# Patient Record
Sex: Female | Born: 1995 | Race: Black or African American | Hispanic: No | Marital: Married | Smoking: Never smoker
Health system: Southern US, Community
[De-identification: ages and names within clinical notes are randomized; demographics above are authoritative.]

## PROBLEM LIST (undated history)

## (undated) DIAGNOSIS — O021 Missed abortion: Secondary | ICD-10-CM

## (undated) DIAGNOSIS — F32A Depression, unspecified: Secondary | ICD-10-CM

## (undated) DIAGNOSIS — F329 Major depressive disorder, single episode, unspecified: Secondary | ICD-10-CM

## (undated) DIAGNOSIS — Z789 Other specified health status: Secondary | ICD-10-CM

## (undated) DIAGNOSIS — Z973 Presence of spectacles and contact lenses: Secondary | ICD-10-CM

## (undated) HISTORY — PX: KNEE SURGERY: SHX244

## (undated) HISTORY — PX: TONSILLECTOMY: SUR1361

---
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
## 2017-01-16 HISTORY — PX: WISDOM TOOTH EXTRACTION: SHX21

## 2017-06-15 ENCOUNTER — Ambulatory Visit (INDEPENDENT_AMBULATORY_CARE_PROVIDER_SITE_OTHER): Payer: No Typology Code available for payment source | Admitting: Family Medicine

## 2017-06-15 ENCOUNTER — Encounter: Payer: Self-pay | Admitting: Family Medicine

## 2017-06-15 VITALS — BP 110/80 | HR 94 | Ht 64.75 in | Wt 150.4 lb

## 2017-06-15 DIAGNOSIS — R5383 Other fatigue: Secondary | ICD-10-CM | POA: Diagnosis not present

## 2017-06-15 DIAGNOSIS — R6889 Other general symptoms and signs: Secondary | ICD-10-CM | POA: Diagnosis not present

## 2017-06-15 LAB — BASIC METABOLIC PANEL
BUN: 11 mg/dL (ref 6–23)
CO2: 25 mEq/L (ref 19–32)
CREATININE: 0.81 mg/dL (ref 0.40–1.20)
Calcium: 9.4 mg/dL (ref 8.4–10.5)
Chloride: 106 mEq/L (ref 96–112)
GFR: 112.67 mL/min (ref >60.00)
Glucose, Bld: 91 mg/dL (ref 70–99)
Potassium: 3.8 mEq/L (ref 3.5–5.1)
Sodium: 140 mEq/L (ref 135–145)

## 2017-06-15 LAB — TSH: TSH: 1.38 u[IU]/mL (ref 0.35–4.50)

## 2017-06-15 LAB — CBC
HCT: 40.5 % (ref 36.0–46.0)
Hemoglobin: 13.3 g/dL (ref 12.0–15.0)
MCHC: 32.8 g/dL (ref 30.0–36.0)
MCV: 87.4 fl (ref 78.0–100.0)
Platelets: 226 10*3/uL (ref 150.0–400.0)
RBC: 4.64 Mil/uL (ref 3.87–5.11)
RDW: 13.1 % (ref 11.5–15.5)
WBC: 4 10*3/uL (ref 4.0–10.5)

## 2017-06-15 NOTE — Patient Instructions (Signed)
It was very nice to meet you! We will contact you once your labs return.

## 2017-06-15 NOTE — Assessment & Plan Note (Signed)
Cold intolerance with fatigue concerning to thyroid disorder, check TSH Will also check CBC to evaluate for anemia.

## 2017-06-15 NOTE — Progress Notes (Signed)
[name] - 22 y.o. female MRN [id number]  Date of birth: [birth year]  Subjective Chief Complaint  Patient presents with  . Other    Cold intolerance    HPI [name] is a 22 y.o. female here today to establish care with new pcp and complaint of cold intolerance.  Reports that she "feels cold all the time" and has noticed that when co-workers are hot she is often times cold.  She has noticed this started after having her baby in 65.  She also endorses some fatigue and dry skin.  She has not noticed any changes to bowel patterns, irregular menstrual periods (although she does have nexplanon), easy bruising or significant weight changes.    No Known Allergies  Past Medical History:  Diagnosis Date  . Medical history non-contributory     Past Surgical History:  Procedure Laterality Date  . KNEE SURGERY    . TONSILLECTOMY      Social History   Socioeconomic History  . Marital status: Single    Spouse name: Not on file  . Number of children: Not on file  . Years of education: Not on file  . Highest education level: Not on file  Occupational History  . Not on file  Social Needs  . Financial resource strain: Not on file  . Food insecurity:    Worry: Not on file    Inability: Not on file  . Transportation needs:    Medical: Not on file    Non-medical: Not on file  Tobacco Use  . Smoking status: Never Smoker  . Smokeless tobacco: Never Used  Substance and Sexual Activity  . Alcohol use: Yes    Comment: occass.   . Drug use: No  . Sexual activity: Yes    Birth control/protection: Implant  Lifestyle  . Physical activity:    Days per week: Not on file    Minutes per session: Not on file  . Stress: Not on file  Relationships  . Social connections:    Talks on phone: Not on file    Gets together: Not on file    Attends religious service: Not on file    Active member of club or organization: Not on file    Attends meetings of clubs or organizations: Not on file    Relationship status: Not on file  Other Topics Concern  . Not on file  Social History Narrative  . Not on file    Family History  Problem Relation Age of Onset  . Diabetes Maternal Grandmother   . Arthritis Maternal Grandmother   . Diabetes Paternal Grandmother     Health Maintenance  Topic Date Due  . CHLAMYDIA SCREENING  06/07/2009  . TETANUS/TDAP  06/07/2013  . PAP SMEAR  06/08/2015  . HIV Screening  06/16/2018 (Originally 06/07/2009)  . INFLUENZA VACCINE  08/16/2017    ----------------------------------------------------------------------------------------------------------------------------------------------------------------------------------------------------------------- Physical Exam BP 110/80   Pulse 94   Ht 5' 4.75" (1.645 m)   Wt 150 lb 6.4 oz (68.2 kg)   BMI 25.22 kg/m   Physical Exam  Constitutional: She is oriented to person, place, and time. She appears well-nourished. No distress.  HENT:  Head: Normocephalic and atraumatic.  Mouth/Throat: Oropharynx is clear and moist.  Eyes: Conjunctivae are normal. No scleral icterus.  Neck: Neck supple. No thyromegaly present.  Cardiovascular: Normal rate and regular rhythm.  Pulmonary/Chest: Effort normal and breath sounds normal.  Musculoskeletal: She exhibits no edema.  Neurological: She is alert and oriented to person,  place, and time.  Skin: Skin is warm and dry. No rash noted.  Psychiatric: She has a normal mood and affect. Her behavior is normal.    ------------------------------------------------------------------------------------------------------------------------------------------------------------------------------------------------------------------- Assessment and Plan  Cold intolerance Cold intolerance with fatigue concerning to thyroid disorder, check TSH Will also check CBC to evaluate for anemia.

## 2018-06-03 ENCOUNTER — Ambulatory Visit (INDEPENDENT_AMBULATORY_CARE_PROVIDER_SITE_OTHER): Payer: 59 | Admitting: Licensed Clinical Social Worker

## 2018-06-03 DIAGNOSIS — F321 Major depressive disorder, single episode, moderate: Secondary | ICD-10-CM

## 2018-06-03 NOTE — Progress Notes (Signed)
Comprehensive Clinical Assessment (CCA) Note  06/03/2018 [name] Nooney [id number]  Virtual Visit via Video Note  I connected with [name] Deyoe on 06/03/18 at  3:00 PM EDT by a video enabled telemedicine application and verified that I am speaking with the correct person using two identifiers.  I discussed the limitations of evaluation and management by telemedicine and the availability of in person appointments. The patient expressed understanding and agreed to proceed.    I discussed the assessment and treatment plan with the patient. The patient was provided an opportunity to ask questions and all were answered. The patient agreed with the plan and demonstrated an understanding of the instructions.   The patient was advised to call back or seek an in-person evaluation if the symptoms worsen or if the condition fails to improve as anticipated.  I provided 56 minutes of non-face-to-face time during this encounter.   Angus Palmsegina Alexander, LCSW     Visit Diagnosis:      ICD-10-CM   1. Major depressive disorder, single episode, moderate (HCC) F32.1       CCA Part One  Part One has been completed on paper by the patient.  (See scanned document in Chart Review)  CCA Part Two A  Intake/Chief Complaint:  CCA Intake With Chief Complaint CCA Part Two Date: 06/03/18 CCA Part Two Time: 1459 Chief Complaint/Presenting Problem: mood changes and feeling unlike herself Patients Currently Reported Symptoms/Problems: irritability, crying spells, feeling very emotional and not usually an emotional person, some days doesn't want to get out of bed Collateral Involvement: mother, grandmother, brothers   Patient Report:  Patient reports that she was married in October 2019, and her husband went off to bootcamp at the beginning of 2020. Last month he was stationed in [place], where she and her daughter will join him. However, they do not yet know when this will happen, and in the meantime patient moved  back into her family home with mother, grandmother, 2 brothers, and cousin. Patient reports that she has been much more emotional for the past few months - before husband moved to [place] - and that she has been keeping to herself to try and keep her emotions at bay. Usually, she states, she is more social and communicative. Patient does not like to cry and acknowledges that she usually tries to push down her emotions, until one day they overwhelm her and husband says that she "explodes".  Patient has a bachelor's degree in nursing and is a Designer, jewelleryregistered nurse at the [employer]. She feels unfulfilled in her work, as it is very much data-entry based and she would like to be more hands-on. 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 She reports being hard on herself because she does not believe she is where she should be in her career (though she has been in the field for only about a year).   Mental Health Symptoms Depression:  Depression: Sleep (too much or little), Irritability, Tearfulness, Change in energy/activity  Mania:  Mania: N/A  Anxiety:   Anxiety: Irritability  Psychosis:  Psychosis: N/A  Trauma:  Trauma: N/A  Obsessions:  Obsessions: N/A  Compulsions:  Compulsions: N/A  Inattention:  Inattention: N/A  Hyperactivity/Impulsivity:  Hyperactivity/Impulsivity: N/A  Oppositional/Defiant Behaviors:  Oppositional/Defiant Behaviors: N/A  Borderline Personality:  Emotional Irregularity:  N/A  Other Mood/Personality Symptoms:      Mental Status Exam Appearance and self-care  Stature:  Stature: Average  Weight:  Weight: Average weight  Clothing:  Clothing: Casual  Grooming:  Grooming: Normal  Cosmetic use:  Cosmetic Use: Age appropriate  Posture/gait:  Posture/Gait: Normal  Motor activity:   Motor Activity: Not Remarkable  Sensorium  Attention:  Attention: Normal  Concentration:  Concentration: Normal  Orientation:  Orientation: X5  Recall/memory:  Recall/Memory: Normal  Affect and Mood  Affect:  Affect: Tearful  Mood:  Mood: Depressed  Relating  Eye contact:  Eye Contact: Normal  Facial expression:  Facial Expression: Responsive  Attitude toward examiner:  Attitude Toward Examiner: Cooperative  Thought and Language  Speech flow: Speech Flow: Normal  Thought content:  Thought Content: Appropriate to mood and circumstances  Preoccupation:     Hallucinations:     Organization:     Company secretary of Knowledge:  Fund of Knowledge: Average  Intelligence:  Intelligence: Above Average  Abstraction:  Abstraction: Abstract, Normal  Judgement:  Judgement: Fair  Dance movement psychotherapist:  Reality Testing: Adequate  Insight:  Insight: Fair  Decision Making:  Decision Making: Normal  Social Functioning  Social Maturity:  Social Maturity: Isolates(Usually social, but lately keeps to herself and doesn't communicate well with others)  Social Judgement:  Social Judgement: Normal  Stress  Stressors:  Stressors: Transitions, Work  Coping Ability:  Coping Ability: Building surveyor Deficits:     Supports:      Family and Psychosocial History: Family history Marital status: Married Number of Years Married: 0.75 What types of issues is patient dealing with in the relationship?: he is stationed in [employer] camp, she is unsure when she and daughter will join him Additional relationship information: good relationships with "ups and downs" Are you sexually active?: Yes What is your sexual orientation?: Straight Does patient have children?: Yes How many children?: 1 How is patient's relationship with their children?: 64 year old daughter, good relationship  Childhood History:  Childhood History By whom was/is the patient raised?: Mother, Grandparents Additional  childhood history information: raised by mother and grandmother, "it was okay, pretty normal", surface level relationship with dad then and now Description of patient's relationship with caregiver when they were a child: good with mom, not always good with grandmother Patient's description of current relationship with people who raised him/her: supportive with mom and grandmother Number of Siblings: 3 Description of patient's current relationship with siblings: 2 full brothers, one step brother; one brother is a twin Did patient suffer any verbal/emotional/physical/sexual abuse as a child?: No Did patient suffer from severe childhood neglect?: No Has patient ever been sexually abused/assaulted/raped as an adolescent or adult?: No Was the patient ever a victim of a crime or a disaster?: No Witnessed domestic violence?: No Has patient been effected by domestic violence as an adult?: No  CCA Part Two B  Employment/Work Situation: Employment / Work Situation Employment situation: Employed Where is patient currently employed?: [employer] How long has patient been employed?: almost 1 year Patient's job has been impacted by current illness: No(mood changes have been noticable to coworkers but not a problem) What is the longest time patient has a held a job?: his job Did You Receive Any Psychiatric Treatment/Services While in Equities trader?: No Are There Guns or Other Weapons in Your Home?: No Are These Weapons Safely Secured?: Yes  Education: Education Last Grade Completed: 16 Did Garment/textile technologist From  High School?: Yes Did You Attend College?: Yes What Type of College Degree Do you Have?: Bachelors  Did You Attend Graduate School?: No What Was Your Major?: nursing Did You Have An Individualized Education Program (IIEP): No Did You Have Any Difficulty At School?: No  Religion: Religion/Spirituality Are You A Religious Person?: No How Might This Affect Treatment?: has  religious beliefs but not really religious  Leisure/Recreation: Leisure / Recreation Leisure and Hobbies: enjoys being active, sports, cosmetology  Exercise/Diet: Exercise/Diet Do You Exercise?: Yes What Type of Exercise Do You Do?: Other (Comment), Run/Walk(sports) How Many Times a Week Do You Exercise?: 1-3 times a week Have You Gained or Lost A Significant Amount of Weight in the Past Six Months?: No Do You Follow a Special Diet?: No Do You Have Any Trouble Sleeping?: Yes Explanation of Sleeping Difficulties: sleeps, sometimes wants to sleep a lot, but mind will not calm down and let her sleep  CCA Part Two C  Alcohol/Drug Use: Alcohol / Drug Use History of alcohol / drug use?: No history of alcohol / drug abuse                      CCA Part Three  ASAM's:  Six Dimensions of Multidimensional Assessment  Dimension 1:  Acute Intoxication and/or Withdrawal Potential:  Dimension 1:  Comments: drinks only about one drink a month; sometimes smokes marijuana when stressed but not regularly  Dimension 2:  Biomedical Conditions and Complications:     Dimension 3:  Emotional, Behavioral, or Cognitive Conditions and Complications:  Dimension 3:  Comments: mood swings, anger/irritability  Dimension 4:  Readiness to Change:  Dimension 4:  Comments: contemplative  Dimension 5:  Relapse, Continued use, or Continued Problem Potential:  Dimension 5:  Comments: unknown when she and daughter will join husband in [place]  Dimension 6:  Recovery/Living Environment:      Substance use Disorder (SUD)    Social Function:  Social Functioning Social Maturity: Isolates(Usually social, but lately keeps to herself and doesn't communicate well with others) Social Judgement: Normal  Stress:  Stress Stressors: Transitions, Work Coping Ability: Overwhelmed Patient Takes Medications The Way The Doctor Instructed?: Yes  Risk Assessment- Self-Harm Potential: Risk Assessment For Self-Harm  Potential Thoughts of Self-Harm: No current thoughts  Risk Assessment -Dangerous to Others Potential: Risk Assessment For Dangerous to Others Potential Method: No Plan  DSM5 Diagnoses: Patient Active Problem List   Diagnosis Date Noted  . Cold intolerance 06/15/2017  . Fatigue 06/15/2017    Patient Centered Plan: Patient is on the following Treatment Plan(s):  Depression  Recommendations for Services/Supports/Treatments: Recommendations for Services/Supports/Treatments Recommendations For Services/Supports/Treatments: Individual Therapy  Treatment Plan Summary:   Elevate mood and show evidence of usual energy, activities and socialization level; Reduce irritability and increase normal social interaction with family and friends.   Angus Palms

## 2018-06-12 ENCOUNTER — Telehealth (HOSPITAL_COMMUNITY): Payer: Self-pay | Admitting: Licensed Clinical Social Worker

## 2018-06-12 NOTE — Telephone Encounter (Signed)
Patient called.  She states that she was suppose to be getting a call back from the office for a follow up after her appointment.   Do you know anything about this?   Patients CB # [phone]

## 2018-06-14 NOTE — Telephone Encounter (Signed)
Apt was made with Shanda Bumps. Nothing further is needed.

## 2018-07-01 ENCOUNTER — Other Ambulatory Visit: Payer: Self-pay

## 2018-07-01 ENCOUNTER — Encounter (HOSPITAL_COMMUNITY): Payer: Self-pay | Admitting: Licensed Clinical Social Worker

## 2018-07-01 ENCOUNTER — Ambulatory Visit (INDEPENDENT_AMBULATORY_CARE_PROVIDER_SITE_OTHER): Payer: 59 | Admitting: Licensed Clinical Social Worker

## 2018-07-01 DIAGNOSIS — F321 Major depressive disorder, single episode, moderate: Secondary | ICD-10-CM

## 2018-07-01 NOTE — Progress Notes (Signed)
Virtual Visit via Video Note  I connected with [name] on 07/01/18 at  4:30 PM EDT by a video enabled telemedicine application and verified that I am speaking with the correct person using two identifiers.    I discussed the limitations of evaluation and management by telemedicine and the availability of in person appointments. The patient expressed understanding and agreed to proceed.  Type of Therapy: Individual Therapy  Treatment Goals addressed: "Elevate mood and show evidence of usual energy, activities, and socialization levels. Reduce irritability and increase normal social interaction with family and friends".   Interventions: CBT, Motivational Interviewing, grounding and mindfulness techniques, psychoeducation  Summary: [name] is a 23 y.o. female who presents with MDD, single episode, moderate  Suicidal/Homicidal: No - without intent/plan  Therapist Response: [name] met with clinician for an individual session. [name] discussed her psychiatric symptoms, her current life events and her homework. She identified previous depressive sxs in CCA and reported that since then, she has become more aware of the root cause. She and husband have a hx of infidelity issues and they have resurfaced again over the past several weeks. Clinician explored the hx of their relationship, as well as current status. Clinician processed options and encouraged [name] to create a pros and cons list in order to get her thoughts out on paper. Clinician reviewed the 5 Love Languages and noted ways to help each other, even though they speak different love languages. Clinician utilized MI OARS to reflect thoughts and feelings. Clinician also agreed that there would be ongoing concerns and fears that husband would be unfaithful, as he has in the past. Clinician urged [name] to spend some time thinking about how the relationship could be or would be if they were living together. Clinician also noted the importance of  self-care and valuation.   Plan: Return again in 1-2 weeks.  Diagnosis: Axis I: MDD, single episode, moderate.   I discussed the assessment and treatment plan with the patient. The patient was provided an opportunity to ask questions and all were answered. The patient agreed with the plan and demonstrated an understanding of the instructions.   The patient was advised to call back or seek an in-person evaluation if the symptoms worsen or if the condition fails to improve as anticipated.  I provided 45 minutes of non-face-to-face time during this encounter.   Mindi Curling, LCSW

## 2018-07-09 ENCOUNTER — Ambulatory Visit (INDEPENDENT_AMBULATORY_CARE_PROVIDER_SITE_OTHER): Payer: 59 | Admitting: Licensed Clinical Social Worker

## 2018-07-09 ENCOUNTER — Encounter (HOSPITAL_COMMUNITY): Payer: Self-pay | Admitting: Licensed Clinical Social Worker

## 2018-07-09 ENCOUNTER — Other Ambulatory Visit: Payer: Self-pay

## 2018-07-09 DIAGNOSIS — F321 Major depressive disorder, single episode, moderate: Secondary | ICD-10-CM

## 2018-07-09 NOTE — Progress Notes (Signed)
Virtual Visit via Video Note  I connected with [name] on 07/09/18 at  1:30 PM EDT by a video enabled telemedicine application and verified that I am speaking with the correct person using two identifiers.     I discussed the limitations of evaluation and management by telemedicine and the availability of in person appointments. The patient expressed understanding and agreed to proceed.  Type of Therapy: Individual Therapy  Treatment Goals addressed: "Elevate mood and show evidence of usual energy, activities, and socialization levels. Reduce irritability and increase normal social interaction with family and friends".   Interventions: CBT, Motivational Interviewing, grounding and mindfulness techniques, psychoeducation  Summary: [name] is a 23 y.o. female who presents with MDD, single episode, moderate  Suicidal/Homicidal: No - without intent/plan  Therapist Response: [name] met with clinician for an individual session. [name] discussed her psychiatric symptoms, her current life events and her homework. [name] provided updates about her relationship with husband and daughter. She reports her daughter went to spend the weekend with husband and his family for Father's Day. Clinician explored comfort and sense of safety with daughter spending time with him. [name] reports she has no concerns about daughter's safety, but noted that she felt guilty when daughter shared a picture she drew with her, her husband , and her daughter all together. She reports she does not think that can be a reality again. Clinician processed thoughts and feelings about this and explored options for their relationship. Clinician discussed values in a healthy marriage. Clinician also explored [name] and her husband's experiences with observing other people's marriages, such as parents, grandparents, etc. [name] reports that while she witnessed and came from solid, long-term relationships, her husband had never  reallywitnessed good relationships in his family. Clinician provided feedback about this and noted that he may not know how to have a healthy relationship, which may be why he sabotages their relationship. Clinician encouraged [name] to continue to evaluate her own needs and expectations from a relationship and to communicate those needs with her husband.   Plan: Return again in 1-2 weeks.  Diagnosis: Axis I: MDD, single episode, moderate    I discussed the assessment and treatment plan with the patient. The patient was provided an opportunity to ask questions and all were answered. The patient agreed with the plan and demonstrated an understanding of the instructions.   The patient was advised to call back or seek an in-person evaluation if the symptoms worsen or if the condition fails to improve as anticipated.  I provided 45 minutes of non-face-to-face time during this encounter.   Mindi Curling, LCSW

## 2018-07-23 ENCOUNTER — Ambulatory Visit (INDEPENDENT_AMBULATORY_CARE_PROVIDER_SITE_OTHER): Payer: 59 | Admitting: Licensed Clinical Social Worker

## 2018-07-23 ENCOUNTER — Other Ambulatory Visit: Payer: Self-pay

## 2018-07-23 ENCOUNTER — Encounter (HOSPITAL_COMMUNITY): Payer: Self-pay | Admitting: Licensed Clinical Social Worker

## 2018-07-23 DIAGNOSIS — F321 Major depressive disorder, single episode, moderate: Secondary | ICD-10-CM

## 2018-07-23 NOTE — Progress Notes (Signed)
Virtual Visit via Video Note  I connected with [name] on 07/23/18 at  2:30 PM EDT by a video enabled telemedicine application and verified that I am speaking with the correct person using two identifiers.    I discussed the limitations of evaluation and management by telemedicine and the availability of in person appointments. The patient expressed understanding and agreed to proceed.  Type of Therapy: Individual Therapy  Treatment Goals addressed:"Elevate mood and show evidence of usual energy, activities, and socialization levels. Reduce irritability and increase normal social interaction with family and friends".  Interventions: CBT, Motivational Interviewing, grounding and mindfulness techniques, psychoeducation  Summary:[name] is a 23y.o. female who presents with MDD, single episode, moderate  Suicidal/Homicidal: No - without intent/plan  Therapist Response:[name] with clinician for an individual session. [name] her psychiatric symptoms, her current life events and her homework. [name] reports that she continues to struggle with decisions about where to go with her relationship with her husband. She reports she continues to have trust issues, but agreed that if they were together, they would more easily mend the relationship. Clinician processed her role in the problems, as [name] reports she has not really taken into account her own role in challenging the relationship. Clinician explored how the relationship was before they got married, when they lived together, and what it was like when he was gone for 4 months in basic training. [name] reported they argued a lot over small things, and she did get mad while he was gone. She agreed this was detrimental to the relationship, and then she was mean to him. Clinician reflected that during that time period, her response to him, due to her depression led him to talk to someone else. While this was not the correct  response, it was what happened. Clinician encouraged [name] to consider what things would be like if they were together and if she was interested in making this family work.   Plan: Return again in1-2weeks.  Diagnosis: Axis I:MDD, single episode, moderate    I discussed the assessment and treatment plan with the patient. The patient was provided an opportunity to ask questions and all were answered. The patient agreed with the plan and demonstrated an understanding of the instructions.   The patient was advised to call back or seek an in-person evaluation if the symptoms worsen or if the condition fails to improve as anticipated.  I provided 45 minutes of non-face-to-face time during this encounter.   Mindi Curling, LCSW

## 2018-07-31 ENCOUNTER — Ambulatory Visit (HOSPITAL_COMMUNITY): Payer: 59 | Admitting: Licensed Clinical Social Worker

## 2018-08-01 ENCOUNTER — Ambulatory Visit (INDEPENDENT_AMBULATORY_CARE_PROVIDER_SITE_OTHER): Payer: 59 | Admitting: Licensed Clinical Social Worker

## 2018-08-01 ENCOUNTER — Other Ambulatory Visit: Payer: Self-pay

## 2018-08-01 ENCOUNTER — Encounter (HOSPITAL_COMMUNITY): Payer: Self-pay | Admitting: Licensed Clinical Social Worker

## 2018-08-01 DIAGNOSIS — F321 Major depressive disorder, single episode, moderate: Secondary | ICD-10-CM

## 2018-08-01 NOTE — Progress Notes (Signed)
Virtual Visit via Video Note  I connected with Toyoko Bedoya on 08/01/18 at  8:00 AM EDT by a video enabled telemedicine application and verified that I am speaking with the correct person using two identifiers.    I discussed the limitations of evaluation and management by telemedicine and the availability of in person appointments. The patient expressed understanding and agreed to proceed. Type of Therapy: Individual Therapy  Treatment Goals addressed:"Elevate mood and show evidence of usual energy, activities, and socialization levels. Reduce irritability and increase normal social interaction with family and friends".  Interventions: CBT, Motivational Interviewing, grounding and mindfulness techniques, psychoeducation  Summary:Ruthie Asper is a 23y.o. female who presents with MDD, single episode, moderate  Suicidal/Homicidal: No - without intent/plan  Therapist Response:Annamariemet with clinician for an individual session. Brunelladiscussed her psychiatric symptoms, her current life events and her homework.Myrakle reports that she has been doing a bit better this week. She reports she will see her husband this weekend for her daughter's birthday and after that, she probably won't see him again for a while. Clinician explored updates in the relationship and noted ongoing evidence that husband continues to seek out female companionship outside of their marriage. Clinician processed these concerns and discussed Annali's thoughts and feelings. Jameyah reports that both she and her husband do not like to be alone. Jalin reports that she also seeks out female attention and processed on and off relationship with another man when things have gone badly with husband. Clinician discussed challenges in Rosanna's experiences with men and noted communication issues and problems with expectations. Devaney agreed that she usually will wait to express her feelings until she is really upset, which often catches her  partners by surprise and makes her come across as very critical. Clinician explored reasons why she does not say how she feels before she gets really upset and Adara did not really have an answer. Clinician encouraged Keisa to examine herself in these relationships and noted the importance of checking her expectations of others. Clinician urged Kierre to especially look at her trigger of being "disrespected" and people not being "considerate of her feelings".   Plan: Return again in1-2weeks.  Diagnosis: Axis I:MDD, single episode, moderate    I discussed the assessment and treatment plan with the patient. The patient was provided an opportunity to ask questions and all were answered. The patient agreed with the plan and demonstrated an understanding of the instructions.   The patient was advised to call back or seek an in-person evaluation if the symptoms worsen or if the condition fails to improve as anticipated.  I provided 45 minutes of non-face-to-face time during this encounter.   Jessica R Schlosberg, LCSW  

## 2018-08-08 ENCOUNTER — Other Ambulatory Visit: Payer: Self-pay

## 2018-08-08 ENCOUNTER — Ambulatory Visit (HOSPITAL_COMMUNITY): Payer: 59 | Admitting: Licensed Clinical Social Worker

## 2018-08-15 ENCOUNTER — Other Ambulatory Visit: Payer: Self-pay

## 2018-08-15 ENCOUNTER — Ambulatory Visit (HOSPITAL_COMMUNITY): Payer: 59 | Admitting: Licensed Clinical Social Worker

## 2018-08-22 ENCOUNTER — Ambulatory Visit (HOSPITAL_COMMUNITY): Payer: 59 | Admitting: Licensed Clinical Social Worker

## 2018-08-28 ENCOUNTER — Inpatient Hospital Stay (HOSPITAL_COMMUNITY): Payer: 59

## 2018-08-28 ENCOUNTER — Other Ambulatory Visit: Payer: Self-pay

## 2018-08-28 ENCOUNTER — Encounter (HOSPITAL_COMMUNITY): Payer: Self-pay

## 2018-08-28 ENCOUNTER — Inpatient Hospital Stay (HOSPITAL_COMMUNITY)
Admission: AD | Admit: 2018-08-28 | Discharge: 2018-08-28 | Disposition: A | Discharge status: Home / Self Care | Payer: 59 | Attending: Obstetrics and Gynecology | Admitting: Obstetrics and Gynecology

## 2018-08-28 DIAGNOSIS — Z6790 Unspecified blood type, Rh positive: Secondary | ICD-10-CM

## 2018-08-28 DIAGNOSIS — Z3A01 Less than 8 weeks gestation of pregnancy: Secondary | ICD-10-CM

## 2018-08-28 DIAGNOSIS — O209 Hemorrhage in early pregnancy, unspecified: Secondary | ICD-10-CM

## 2018-08-28 DIAGNOSIS — O3680X0 Pregnancy with inconclusive fetal viability, not applicable or unspecified: Secondary | ICD-10-CM

## 2018-08-28 HISTORY — DX: Depression, unspecified: F32.A

## 2018-08-28 LAB — CBC
HCT: 37.8 % (ref 36.0–46.0)
Hemoglobin: 12 g/dL (ref 12.0–15.0)
MCH: 28.4 pg (ref 26.0–34.0)
MCHC: 31.7 g/dL (ref 30.0–36.0)
MCV: 89.4 fL (ref 80.0–100.0)
Platelets: 213 10*3/uL (ref 150–400)
RBC: 4.23 MIL/uL (ref 3.87–5.11)
RDW: 12.7 % (ref 11.5–15.5)
WBC: 4.5 10*3/uL (ref 4.0–10.5)
nRBC: 0 % (ref 0.0–0.2)

## 2018-08-28 LAB — WET PREP, GENITAL
Clue Cells Wet Prep HPF POC: NONE SEEN
Sperm: NONE SEEN
Trich, Wet Prep: NONE SEEN
Yeast Wet Prep HPF POC: NONE SEEN

## 2018-08-28 LAB — URINALYSIS, ROUTINE W REFLEX MICROSCOPIC
Bilirubin Urine: NEGATIVE
Glucose, UA: NEGATIVE mg/dL
Ketones, ur: NEGATIVE mg/dL
Leukocytes,Ua: NEGATIVE
Nitrite: NEGATIVE
Protein, ur: NEGATIVE mg/dL
Specific Gravity, Urine: 1.011 (ref 1.005–1.030)
pH: 5 (ref 5.0–8.0)

## 2018-08-28 LAB — HCG, QUANTITATIVE, PREGNANCY: hCG, Beta Chain, Quant, S: 176 m[IU]/mL — ABNORMAL HIGH (ref <5)

## 2018-08-28 LAB — POCT PREGNANCY, URINE: Preg Test, Ur: POSITIVE — AB

## 2018-08-28 NOTE — MAU Note (Signed)
[name] is a 23 y.o. at [redacted]w[redacted]d here in MAU reporting: got up around 0300 to use bathroom and saw some bleeding, states she put a pad on. Around 0700 she noticed the bleeding was trickling, and around 0800 she noticed some quarter-sized clots. Having some mild cramping.  LMP: 07/13/18  Onset of complaint: this morning  Pain score: 1/10  Vitals:   08/28/18 0941  BP: 113/63  Pulse: 88  Resp: 18  Temp: 98.6 F (37 C)  SpO2: 100%      Lab orders placed from triage: UA, UPT

## 2018-08-28 NOTE — Discharge Instructions (Signed)
Vaginal Bleeding During Pregnancy, First Trimester ° °A small amount of bleeding from the vagina (spotting) is relatively common during early pregnancy. It usually stops on its own. Various things may cause bleeding or spotting during early pregnancy. Some bleeding may be related to the pregnancy, and some may not. In many cases, the bleeding is normal and is not a problem. However, bleeding can also be a sign of something serious. Be sure to tell your health care provider about any vaginal bleeding right away. °Some possible causes of vaginal bleeding during the first trimester include: °· Infection or inflammation of the cervix. °· Growths (polyps) on the cervix. °· Miscarriage or threatened miscarriage. °· Pregnancy tissue developing outside of the uterus (ectopic pregnancy). °· A mass of tissue developing in the uterus due to an egg being fertilized incorrectly (molar pregnancy). °Follow these instructions at home: °Activity °· Follow instructions from your health care provider about limiting your activity. Ask what activities are safe for you. °· If needed, make plans for someone to help with your regular activities. °· Do not have sex or orgasms until your health care provider says that this is safe. °General instructions °· Take over-the-counter and prescription medicines only as told by your health care provider. °· Pay attention to any changes in your symptoms. °· Do not use tampons or douche. °· Write down how many pads you use each day, how often you change pads, and how soaked (saturated) they are. °· If you pass any tissue from your vagina, save the tissue so you can show it to your health care provider. °· Keep all follow-up visits as told by your health care provider. This is important. °Contact a health care provider if: °· You have vaginal bleeding during any part of your pregnancy. °· You have cramps or labor pains. °· You have a fever. °Get help right away if: °· You have severe cramps in your  back or abdomen. °· You pass large clots or a large amount of tissue from your vagina. °· Your bleeding increases. °· You feel light-headed or weak, or you faint. °· You have chills. °· You are leaking fluid or have a gush of fluid from your vagina. °Summary °· A small amount of bleeding (spotting) from the vagina is relatively common during early pregnancy. °· Various things may cause bleeding or spotting in early pregnancy. °· Be sure to tell your health care provider about any vaginal bleeding right away. °This information is not intended to replace advice given to you by your health care provider. Make sure you discuss any questions you have with your health care provider. °Document Released: 10/12/2004 Document Revised: 04/23/2018 Document Reviewed: 04/06/2016 °Elsevier Patient Education © 2020 [employer] ° °

## 2018-08-28 NOTE — MAU Provider Note (Addendum)
History     CSN: [id number]  Arrival date and time: 08/28/18 [id number]   First Provider Initiated Contact with Patient 08/28/18 0945     Chief Complaint  Patient presents with  . Abdominal Pain  . Vaginal Bleeding   HPI [name] is a 23 y/o G2P1001 @ 3649w4d by sure LMP presents to MAU for vaginal bleeding and lower abdominal pain. This was a planned pregnancy. Pt reports bleeding started at 0300 and similar to day one of menses that has since slowed down to a trickle. At 0800, she passed quarter sized blood clots. Pt states mild cramping and diffuse lower abdominal pain. Denies fever, vaginal discharge, dysuria, N/V or flank pain.  OB History    Gravida  2   Para  1   Term  1   Preterm      AB      Living  1     SAB      TAB      Ectopic      Multiple      Live Births  1           Past Medical History:  Diagnosis Date  . Depression   . Medical history non-contributory     Past Surgical History:  Procedure Laterality Date  . KNEE SURGERY    . TONSILLECTOMY    . WISDOM TOOTH EXTRACTION      Family History  Problem Relation Age of Onset  . Diabetes Maternal Grandmother   . Arthritis Maternal Grandmother   . Diabetes Paternal Grandmother     Social History   Tobacco Use  . Smoking status: Never Smoker  . Smokeless tobacco: Never Used  Substance Use Topics  . Alcohol use: Yes    Comment: occass.   . Drug use: No    Allergies: No Known Allergies  No medications prior to admission.    Review of Systems  Constitutional: Negative for chills, fatigue and fever.  Respiratory: Negative for cough, chest tightness and shortness of breath.   Cardiovascular: Negative for chest pain, palpitations and leg swelling.  Gastrointestinal: Negative for abdominal pain, constipation, diarrhea, nausea and vomiting.  Endocrine: Positive for cold intolerance.  Genitourinary: Positive for vaginal bleeding. Negative for dysuria, flank pain, genital sores,  hematuria, pelvic pain, vaginal discharge and vaginal pain.  Musculoskeletal: Negative for back pain and myalgias.  Skin: Negative for color change and rash.  Neurological: Negative for dizziness, light-headedness and headaches.   Physical Exam   Blood pressure 113/63, pulse 88, temperature 98.6 F (37 C), temperature source Oral, resp. rate 18, height 5\' 4"  (1.626 m), weight 69.7 kg, last menstrual period 07/13/2018, SpO2 100 %, unknown if currently breastfeeding.  Physical Exam  Nursing note and vitals reviewed. Constitutional: She is oriented to person, place, and time. She appears well-developed and well-nourished.  Appears comfortable  HENT:  Head: Normocephalic and atraumatic.  Eyes: Conjunctivae are normal.  Cardiovascular: Normal rate, regular rhythm and normal heart sounds.  Respiratory: Effort normal and breath sounds normal.  GI: Soft. Bowel sounds are normal. She exhibits no mass. There is no rebound and no guarding.  Diffuse lower abdominal tenderness  Genitourinary:    No vaginal discharge.     Genitourinary Comments: Pelvic Exam:  External genitalia: normal appearing vulva with no masses, tenderness or lesions Vagina: no abnormal discharge, minimal bleeding Cervix: pink, no cervical motion tenderness Adnexa: nontender, no palpable masses   Neurological: She is alert and oriented to person, place,  and time.  Skin: Skin is warm and dry. No rash noted. No erythema.  Psychiatric: She has a normal mood and affect. Her behavior is normal.   Results for orders placed or performed during the hospital encounter of 08/28/18 (from the past 24 hour(s))  Pregnancy, urine POC     Status: Abnormal   Collection Time: 08/28/18  9:25 AM  Result Value Ref Range   Preg Test, Ur POSITIVE (A) NEGATIVE  Urinalysis, Routine w reflex microscopic     Status: Abnormal   Collection Time: 08/28/18  9:30 AM  Result Value Ref Range   Color, Urine STRAW (A) YELLOW   APPearance CLEAR CLEAR    Specific Gravity, Urine 1.011 1.005 - 1.030   pH 5.0 5.0 - 8.0   Glucose, UA NEGATIVE NEGATIVE mg/dL   Hgb urine dipstick LARGE (A) NEGATIVE   Bilirubin Urine NEGATIVE NEGATIVE   Ketones, ur NEGATIVE NEGATIVE mg/dL   Protein, ur NEGATIVE NEGATIVE mg/dL   Nitrite NEGATIVE NEGATIVE   Leukocytes,Ua NEGATIVE NEGATIVE   WBC, UA 0-5 0 - 5 WBC/hpf   Bacteria, UA RARE (A) NONE SEEN   Squamous Epithelial / LPF 0-5 0 - 5   Mucus PRESENT   CBC     Status: None   Collection Time: 08/28/18 10:25 AM  Result Value Ref Range   WBC 4.5 4.0 - 10.5 K/uL   RBC 4.23 3.87 - 5.11 MIL/uL   Hemoglobin 12.0 12.0 - 15.0 g/dL   HCT 82.937.8 56.236.0 - 13.046.0 %   MCV 89.4 80.0 - 100.0 fL   MCH 28.4 26.0 - 34.0 pg   MCHC 31.7 30.0 - 36.0 g/dL   RDW 86.512.7 78.411.5 - 69.615.5 %   Platelets 213 150 - 400 K/uL   nRBC 0.0 0.0 - 0.2 %  hCG, quantitative, pregnancy     Status: Abnormal   Collection Time: 08/28/18 10:25 AM  Result Value Ref Range   hCG, Beta Chain, Quant, S 176 (H) <5 mIU/mL  Wet prep, genital     Status: Abnormal   Collection Time: 08/28/18 10:35 AM   Specimen: Cervical/Vaginal swab  Result Value Ref Range   Yeast Wet Prep HPF POC NONE SEEN NONE SEEN   Trich, Wet Prep NONE SEEN NONE SEEN   Clue Cells Wet Prep HPF POC NONE SEEN NONE SEEN   WBC, Wet Prep HPF POC MANY (A) NONE SEEN   Sperm NONE SEEN    [place] Ob Less Than 14 Weeks With Ob Transvaginal  Result Date: 08/28/2018 CLINICAL DATA:  Vaginal bleeding. Estimated gestational age of [redacted] weeks, 4 days by LMP. EXAM: OBSTETRIC <14 WK US AND TRANSVAGINAL OB US TECHNIQUE: Both transabdominal and transvaginal ultrasound examinations were performed for complete evaluation of the gestation as well as the maternal uterus, adnexal regions, and pelvic cul-de-sac. Transvaginal technique was performed to assess early pregnancy. COMPARISON:  None. FINDINGS: Intrauterine gestational sac: None. Maternal uterus/adnexae: Small 1 cm mobile area of complex hyper and hypoechoic material  within the endometrial canal. The ovaries are unremarkable. Left corpus luteum. IMPRESSION: 1. No IUP is visualized. By definition, in the setting of a positive pregnancy test, this reflects a pregnancy of unknown location. Differential considerations include early normal IUP, abnormal IUP/missed abortion, or nonvisualized ectopic pregnancy. Serial beta HCG is suggested. Consider repeat pelvic ultrasound in 14 days. 2. Small 1 cm mobile area of complex hyper and hypoechoic material within the endometrial canal, suspicious for blood products. Electronically Signed   By: Vickki HearingWilliam T Derry M.D.  On: 08/28/2018 11:41   MAU Course  Procedures - N/A  MDM R/o appendicitis - No symptoms of periumbilical to RLQ pain. Afebrile and normal WBC. R/o UTI - No UA WBC or symptoms of dysuria, frequency, urgency. R/o STI - No discharge, wet prep WNL, GC/C pending   No IUP or adnexal mass seen on [place], suspect failed pregnancy but cannot r/o early pregnancy or ectopic pregnancy-discussed with pt. Will follow quant in 48 hrs. Message left on OB office voicemail.   Assessment and Plan  1. Pregnancy of unknown location 2. Rh positive  - Discharge home - [facility] up at [facility] in 2 days  - Repeat serial BhCG in 48 hours. Appointment scheduled for 8/14. - Pt discharged home with strict return precautions  Julianne Handler, CNM 08/28/2018, 12:11 PM

## 2018-08-29 LAB — GC/CHLAMYDIA PROBE AMP (~~LOC~~) NOT AT ARMC
Chlamydia: NEGATIVE
Neisseria Gonorrhea: NEGATIVE

## 2018-09-04 ENCOUNTER — Other Ambulatory Visit: Payer: Self-pay

## 2018-09-04 ENCOUNTER — Encounter (HOSPITAL_COMMUNITY): Payer: Self-pay | Admitting: Licensed Clinical Social Worker

## 2018-09-04 ENCOUNTER — Ambulatory Visit (INDEPENDENT_AMBULATORY_CARE_PROVIDER_SITE_OTHER): Payer: 59 | Admitting: Licensed Clinical Social Worker

## 2018-09-04 DIAGNOSIS — F321 Major depressive disorder, single episode, moderate: Secondary | ICD-10-CM | POA: Diagnosis not present

## 2018-09-04 NOTE — Progress Notes (Signed)
Virtual Visit via Video Note  I connected with [name] on 09/04/18 at  4:30 PM EDT by a video enabled telemedicine application and verified that I am speaking with the correct person using two identifiers.     I discussed the limitations of evaluation and management by telemedicine and the availability of in person appointments. The patient expressed understanding and agreed to proceed.  Treatment Goals addressed:"Elevate mood and show evidence of usual energy, activities, and socialization levels. Reduce irritability and increase normal social interaction with family and friends".  Interventions: CBT, Motivational Interviewing, grounding and mindfulness techniques, psychoeducation  Summary:[name] is a 23y.o. female who presents with MDD, single episode, moderate  Suicidal/Homicidal: No - without intent/plan  Therapist Response:[name] with clinician for an individual session. [name], her current life events and her homework.[name] reported she was in [place] with her husband and had been up there for about 2 weeks. Clinician explored the status of the relationship and noted that things were going much better. She reports that she plans to find a job and move to [place] to be with her husband, but she will not officially move until employment is secured. Clinician explored marriage counseling. [name] reported they have already had 2 sessions and things were going well.  [name] processed recent miscarriage. Clinician discussed thoughts and feelings. Clinician also identified the importance of allowing herself to grieve the loss, as she had gotten excited for a new baby. [name] reports she understands it was not the "right" time, as she was getting ready to move, start a new job, she would be alone, and her husband may be deployed in January. Clinician provided grief counseling.   Plan: Return again in1-2weeks.  Diagnosis: Axis I:MDD, single episode,  moderate   I discussed the assessment and treatment plan with the patient. The patient was provided an opportunity to ask questions and all were answered. The patient agreed with the plan and demonstrated an understanding of the instructions.   The patient was advised to call back or seek an in-person evaluation if the symptoms worsen or if the condition fails to improve as anticipated.  I provided 45 minutes of non-face-to-face time during this encounter.   Mindi Curling, LCSW

## 2018-09-16 ENCOUNTER — Ambulatory Visit (INDEPENDENT_AMBULATORY_CARE_PROVIDER_SITE_OTHER): Payer: 59 | Admitting: Licensed Clinical Social Worker

## 2018-09-16 ENCOUNTER — Encounter (HOSPITAL_COMMUNITY): Payer: Self-pay | Admitting: Licensed Clinical Social Worker

## 2018-09-16 ENCOUNTER — Other Ambulatory Visit: Payer: Self-pay

## 2018-09-16 DIAGNOSIS — F321 Major depressive disorder, single episode, moderate: Secondary | ICD-10-CM

## 2018-09-16 NOTE — Progress Notes (Signed)
Virtual Visit via Video Note  I connected with [name] on 09/16/18 at  3:30 PM EDT by a video enabled telemedicine application and verified that I am speaking with the correct person using two identifiers.     I discussed the limitations of evaluation and management by telemedicine and the availability of in person appointments. The patient expressed understanding and agreed to proceed.   Treatment Goals addressed:"Elevate mood and show evidence of usual energy, activities, and socialization levels. Reduce irritability and increase normal social interaction with family and friends".  Interventions: CBT, Motivational Interviewing, grounding and mindfulness techniques, psychoeducation  Summary:[name] is a 23y.o. female who presents with MDD, single episode, moderate  Suicidal/Homicidal: No - without intent/plan  Therapist Response:Caroline Reesemet with clinician for an individual session. Caroline Reesediscussed her psychiatric symptoms, her current life events and her homework.[name] reported she returned to [place] after her visit with her husband and within a few days, he had the other woman in their apartment for long enough to take a selfie and post online. Clinician processed [name]'s thoughts and feelings about this and noted frustration, sadness, and a deep sense of betrayal. [name] spoke to him about her feelings and has rethought her own goals. Clinician validated the change of heart and noted the importance for [name] to focus on herself and her daughter. 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   Plan: Return again in1-2weeks.  Diagnosis: Axis I:MDD, single episode, moderate   I discussed the assessment and treatment plan with the patient. The patient was provided an opportunity to ask questions and all were answered. The patient agreed with the plan and demonstrated an understanding of the instructions.   The patient was advised  to call back or seek an in-person evaluation if the symptoms worsen or if the condition fails to improve as anticipated.  I provided 45 minutes of non-face-to-face time during this encounter.   Jessica R Schlosberg, LCSW  

## 2018-09-17 ENCOUNTER — Ambulatory Visit (HOSPITAL_COMMUNITY): Payer: 59 | Admitting: Licensed Clinical Social Worker

## 2018-10-02 ENCOUNTER — Ambulatory Visit (HOSPITAL_COMMUNITY): Payer: 59 | Admitting: Licensed Clinical Social Worker

## 2018-10-02 ENCOUNTER — Other Ambulatory Visit: Payer: Self-pay

## 2019-01-27 ENCOUNTER — Inpatient Hospital Stay (HOSPITAL_COMMUNITY)
Admission: AD | Admit: 2019-01-27 | Discharge: 2019-01-27 | Disposition: A | Discharge status: Home / Self Care | Attending: Obstetrics and Gynecology | Admitting: Obstetrics and Gynecology

## 2019-01-27 ENCOUNTER — Inpatient Hospital Stay (HOSPITAL_COMMUNITY)

## 2019-01-27 ENCOUNTER — Encounter (HOSPITAL_COMMUNITY): Payer: Self-pay | Admitting: *Deleted

## 2019-01-27 ENCOUNTER — Other Ambulatory Visit: Payer: Self-pay

## 2019-01-27 DIAGNOSIS — O3680X0 Pregnancy with inconclusive fetal viability, not applicable or unspecified: Secondary | ICD-10-CM

## 2019-01-27 DIAGNOSIS — O209 Hemorrhage in early pregnancy, unspecified: Secondary | ICD-10-CM | POA: Diagnosis present

## 2019-01-27 DIAGNOSIS — Z3A01 Less than 8 weeks gestation of pregnancy: Secondary | ICD-10-CM | POA: Insufficient documentation

## 2019-01-27 LAB — CBC
HCT: 43 % (ref 36.0–46.0)
Hemoglobin: 13.9 g/dL (ref 12.0–15.0)
MCH: 28.7 pg (ref 26.0–34.0)
MCHC: 32.3 g/dL (ref 30.0–36.0)
MCV: 88.7 fL (ref 80.0–100.0)
Platelets: 233 10*3/uL (ref 150–400)
RBC: 4.85 MIL/uL (ref 3.87–5.11)
RDW: 12.3 % (ref 11.5–15.5)
WBC: 4.5 10*3/uL (ref 4.0–10.5)
nRBC: 0 % (ref 0.0–0.2)

## 2019-01-27 LAB — URINALYSIS, ROUTINE W REFLEX MICROSCOPIC
Bilirubin Urine: NEGATIVE
Glucose, UA: NEGATIVE mg/dL
Hgb urine dipstick: NEGATIVE
Ketones, ur: NEGATIVE mg/dL
Leukocytes,Ua: NEGATIVE
Nitrite: NEGATIVE
Protein, ur: NEGATIVE mg/dL
Specific Gravity, Urine: 1.017 (ref 1.005–1.030)
pH: 5 (ref 5.0–8.0)

## 2019-01-27 LAB — WET PREP, GENITAL
Clue Cells Wet Prep HPF POC: NONE SEEN
Sperm: NONE SEEN
Trich, Wet Prep: NONE SEEN
Yeast Wet Prep HPF POC: NONE SEEN

## 2019-01-27 LAB — HIV ANTIBODY (ROUTINE TESTING W REFLEX): HIV Screen 4th Generation wRfx: NONREACTIVE

## 2019-01-27 LAB — POCT PREGNANCY, URINE: Preg Test, Ur: POSITIVE — AB

## 2019-01-27 LAB — HCG, QUANTITATIVE, PREGNANCY: hCG, Beta Chain, Quant, S: 1826 m[IU]/mL — ABNORMAL HIGH (ref <5)

## 2019-01-27 NOTE — MAU Provider Note (Signed)
History     CSN: [id number]  Arrival date and time: 01/27/19 0626   First Provider Initiated Contact with Patient 01/27/19 [phone]      Chief Complaint  Patient presents with  . Vaginal Bleeding   HPI [name] 23 y.o. Unknown gestation/uncertain LMP.  Was trying to start Nuvaring and when she placed it, she bled for 2 weeks in November then took out the Nuvaring and continued to have vaginal bleeding for one more week.  Had positive pregnancy test on Friday.  Yesterday started having some vaginal bleeding when she wiped and some cramping.  Today the bleeding is less and she is not cramping.  OB History    Gravida  3   Para  1   Term  1   Preterm      AB      Living  1     SAB      TAB      Ectopic      Multiple      Live Births  1           Past Medical History:  Diagnosis Date  . Depression   . Medical history non-contributory     Past Surgical History:  Procedure Laterality Date  . KNEE SURGERY    . TONSILLECTOMY    . WISDOM TOOTH EXTRACTION      Family History  Problem Relation Age of Onset  . Diabetes Maternal Grandmother   . Arthritis Maternal Grandmother   . Diabetes Paternal Grandmother   . Hypertension Mother   . Healthy Father     Social History   Tobacco Use  . Smoking status: Never Smoker  . Smokeless tobacco: Never Used  Substance Use Topics  . Alcohol use: Yes    Comment: occass.   . Drug use: No    Allergies: No Known Allergies  No medications prior to admission.    Review of Systems  Constitutional: Negative for fever.  Respiratory: Negative for cough and shortness of breath.   Gastrointestinal: Positive for abdominal pain. Negative for diarrhea, nausea and vomiting.  Genitourinary: Positive for vaginal bleeding. Negative for dysuria and vaginal discharge.   Physical Exam   Blood pressure 118/67, pulse 96, temperature 98.3 F (36.8 C), temperature source Oral, resp. rate 20, height 5\' 5"  (1.651 m), weight 66.1  kg, SpO2 100 %, unknown if currently breastfeeding.  Physical Exam  Nursing note and vitals reviewed. Constitutional: She is oriented to person, place, and time. She appears well-developed and well-nourished.  HENT:  Head: Normocephalic.  Eyes: EOM are normal.  GI: Soft. There is no abdominal tenderness. There is no rebound and no guarding.  Not able to palpate fundus above the umbilicus  Genitourinary:    Genitourinary Comments: Speculum exam: Vagina - Small amount of white discharge, no odor, small amount of dark blood seen behind the cervix  No active bleeding. Cervix - No contact bleeding Bimanual exam: Cervix closed Uterus non tender, 6 week size Adnexa non tender, no masses bilaterally GC/Chlam, wet prep done Chaperone present for exam.    Musculoskeletal:        General: Normal range of motion.     Cervical back: Neck supple.  Neurological: She is alert and oriented to person, place, and time.  Skin: Skin is warm and dry.  Psychiatric: She has a normal mood and affect.    MAU Course  Procedures CLINICAL DATA:  Vaginal bleeding in first trimester of pregnancy.  EXAM: OBSTETRIC <  14 WK [place] AND TRANSVAGINAL OB US  TECHNIQUE: Both transabdominal and transvaginal ultrasound examinations were performed for complete evaluation of the gestation as well as the maternal uterus, adnexal regions, and pelvic cul-de-sac. Transvaginal technique was performed to assess early pregnancy.  COMPARISON:  None.  FINDINGS: Intrauterine gestational sac: Single visualized.  Yolk sac:  Not Visualized.  Embryo:  Not Visualized.  Cardiac Activity: Not Visualized.  MSD: 2.4 mm   4 w   6 d  Subchorionic hemorrhage:  Small subchronic hemorrhage is noted.  Maternal uterus/adnexae: Left ovary appears normal. Corpus luteum cyst is seen in right ovary. Small amount of free fluid is noted which most likely is physiologic.  IMPRESSION: Probable early intrauterine gestational  sac, but no yolk sac, fetal pole, or cardiac activity yet visualized. Recommend follow-up quantitative B-HCG levels and follow-up US in 14 days to assess viability. This recommendation follows SRU consensus guidelines: Diagnostic Criteria for Nonviable Pregnancy Early in the First Trimester. [employer] Med 2013; 102:5852-77.  LABS Results for orders placed or performed during the hospital encounter of 01/27/19 (from the past 24 hour(s))  Pregnancy, urine POC     Status: Abnormal   Collection Time: 01/27/19  9:20 AM  Result Value Ref Range   Preg Test, Ur POSITIVE (A) NEGATIVE  Urinalysis, Routine w reflex microscopic     Status: None   Collection Time: 01/27/19  9:23 AM  Result Value Ref Range   Color, Urine YELLOW YELLOW   APPearance CLEAR CLEAR   Specific Gravity, Urine 1.017 1.005 - 1.030   pH 5.0 5.0 - 8.0   Glucose, UA NEGATIVE NEGATIVE mg/dL   Hgb urine dipstick NEGATIVE NEGATIVE   Bilirubin Urine NEGATIVE NEGATIVE   Ketones, ur NEGATIVE NEGATIVE mg/dL   Protein, ur NEGATIVE NEGATIVE mg/dL   Nitrite NEGATIVE NEGATIVE   Leukocytes,Ua NEGATIVE NEGATIVE  Wet prep, genital     Status: Abnormal   Collection Time: 01/27/19 10:22 AM   Specimen: Cervix  Result Value Ref Range   Yeast Wet Prep HPF POC NONE SEEN NONE SEEN   Trich, Wet Prep NONE SEEN NONE SEEN   Clue Cells Wet Prep HPF POC NONE SEEN NONE SEEN   WBC, Wet Prep HPF POC MANY (A) NONE SEEN   Sperm NONE SEEN   CBC     Status: None   Collection Time: 01/27/19 10:25 AM  Result Value Ref Range   WBC 4.5 4.0 - 10.5 K/uL   RBC 4.85 3.87 - 5.11 MIL/uL   Hemoglobin 13.9 12.0 - 15.0 g/dL   HCT 43.0 36.0 - 46.0 %   MCV 88.7 80.0 - 100.0 fL   MCH 28.7 26.0 - 34.0 pg   MCHC 32.3 30.0 - 36.0 g/dL   RDW 12.3 11.5 - 15.5 %   Platelets 233 150 - 400 K/uL   nRBC 0.0 0.0 - 0.2 %     MDM Blood Type AB positive Return to MAU with severe vaginal bleeding or severe abdominal pain.  Patient informed that at this time the  pregnancy cannot be confirmed to be in the uterus as no yolk sac is visualized on ultrasound.  Advised to return with worsening symptoms as an ectopic pregnancy cannot be ruled out at this time and this could be a life threatening condition. Pelvic rest - no tampons, no douching, no sex, nothing in the vagina. No heavy lifting or strenuous exercise. Appt made for follow up quant at [place] and Plan  Pregnancy  of Unknown Anatomic location Small subchorionic hemorrhage which may contribute to the vaginal bleeding and cramping you have been having.  Plan Keep your appointment in the clinic for your follow up lab work.  Plan to stay for the results and it will take 1-2 hours. Take Tylenol 325 mg 2 tablets by mouth every 4 hours if needed for pain. Drink at least 8 8-oz glasses of water every day. No smoking, no alcohol, no drugs other than the ones listed for you. Reviewed importance of no intercourse, no heavy exercise, no heavy lifting. Return if abdominal pain worsens.  Russell Quinney L Messina Kosinski 01/27/2019, 10:51 AM

## 2019-01-27 NOTE — MAU Note (Signed)
Presents with c/o VB with wiping.  Reports VB was heavier last night.  Denies recent intercourse.  +HPT.  LMP unsure, possibly November.

## 2019-01-27 NOTE — Discharge Instructions (Signed)
Keep your appointment in the clinic for your follow up lab work.  Plan to stay for the results and it will take 1-2 hours. Take Tylenol 325 mg 2 tablets by mouth every 4 hours if needed for pain. Drink at least 8 8-oz glasses of water every day. No smoking, no alcohol, no drugs other than the ones listed for you.

## 2019-01-28 LAB — GC/CHLAMYDIA PROBE AMP (~~LOC~~) NOT AT ARMC
Chlamydia: NEGATIVE
Comment: NEGATIVE
Comment: NORMAL
Neisseria Gonorrhea: NEGATIVE

## 2019-01-28 LAB — RPR: RPR Ser Ql: NONREACTIVE

## 2019-01-29 ENCOUNTER — Ambulatory Visit (INDEPENDENT_AMBULATORY_CARE_PROVIDER_SITE_OTHER)

## 2019-01-29 ENCOUNTER — Other Ambulatory Visit: Payer: Self-pay

## 2019-01-29 DIAGNOSIS — O3680X0 Pregnancy with inconclusive fetal viability, not applicable or unspecified: Secondary | ICD-10-CM

## 2019-01-29 LAB — BETA HCG QUANT (REF LAB): hCG Quant: 3043 m[IU]/mL

## 2019-01-29 NOTE — Progress Notes (Signed)
Pt here today for stat beta HCG following visit to MAU on 01/27/19 with vaginal bleeding and abdominal cramps following positive pregnancy test. Pt denies any pain or bleeding since that time. Lab drawn. Ectopic precautions reviewed with patient. Explained that I will call with results before end of day after reviewing with a provider.   Beta HCG result today is 3043, increased from 1826 on 01/27/19. Results and recent pt hx reviewed with Vonzella Nipple, PA who states this is a normal rise in beta HCG during pregnancy. Provider recommends pt have a repeat US in 7-10 days to confirm location and to continue ectopic precautions until that time.  Called pt. Reviewed results and provider recommendation. [place] scheduled for 02/05/19 at 1400; pt notified to arrive at 1345. Ectopic precautions reviewed. Pt has no questions at this time.   Fleet Contras RN 01/29/19

## 2019-01-30 NOTE — Progress Notes (Signed)
Patient seen and assessed by nursing staff during this encounter. I have reviewed the chart and agree with the documentation and plan.  Vonzella Nipple, PA-C 01/30/2019 9:26 AM

## 2019-02-05 ENCOUNTER — Other Ambulatory Visit: Payer: Self-pay

## 2019-02-05 ENCOUNTER — Ambulatory Visit (HOSPITAL_COMMUNITY)
Admission: RE | Admit: 2019-02-05 | Discharge: 2019-02-05 | Disposition: A | Discharge status: Home / Self Care | Source: Ambulatory Visit | Attending: Medical | Admitting: Medical

## 2019-02-05 ENCOUNTER — Ambulatory Visit (INDEPENDENT_AMBULATORY_CARE_PROVIDER_SITE_OTHER): Admitting: Lactation Services

## 2019-02-05 DIAGNOSIS — O3680X0 Pregnancy with inconclusive fetal viability, not applicable or unspecified: Secondary | ICD-10-CM | POA: Diagnosis present

## 2019-02-05 DIAGNOSIS — Z3480 Encounter for supervision of other normal pregnancy, unspecified trimester: Secondary | ICD-10-CM

## 2019-02-05 NOTE — Progress Notes (Signed)
Pt here for dating [place]. Pt to have follow up US in 10-14 days for viability.   Pt reports no cramping or bleeding. She is taking her PNV.   Pt with minimal nausea.   Pt with no questions or concerns at this time.

## 2019-02-07 NOTE — Progress Notes (Signed)
Patient seen and assessed by nursing staff during this encounter. I have reviewed the chart and agree with the documentation and plan.  Lindsay Bing, MD 02/07/2019 9:14 AM

## 2019-02-18 ENCOUNTER — Ambulatory Visit (INDEPENDENT_AMBULATORY_CARE_PROVIDER_SITE_OTHER): Admitting: General Practice

## 2019-02-18 ENCOUNTER — Other Ambulatory Visit: Payer: Self-pay

## 2019-02-18 ENCOUNTER — Ambulatory Visit (HOSPITAL_COMMUNITY)
Admission: RE | Admit: 2019-02-18 | Discharge: 2019-02-18 | Disposition: A | Discharge status: Home / Self Care | Source: Ambulatory Visit | Attending: Obstetrics and Gynecology | Admitting: Obstetrics and Gynecology

## 2019-02-18 DIAGNOSIS — Z3480 Encounter for supervision of other normal pregnancy, unspecified trimester: Secondary | ICD-10-CM

## 2019-02-18 DIAGNOSIS — Z712 Person consulting for explanation of examination or test findings: Secondary | ICD-10-CM

## 2019-02-18 NOTE — Progress Notes (Signed)
Patient presents to office today for viability ultrasound results. Reviewed results with Gerrit Heck who finds single living [facility]UP- patient should begin OB care.   Informed patient of results, reviewed dating, & provided pictures. Patient verbalized understanding & plans care at [facility].  Chase Caller RN BSN 02/18/19

## 2019-02-19 NOTE — Progress Notes (Signed)
Patient seen and assessed by nursing staff during this encounter. I have reviewed the chart and agree with the documentation and plan.  Cherre Robins, CNM 02/19/2019 8:59 AM

## 2019-04-03 ENCOUNTER — Other Ambulatory Visit: Payer: Self-pay

## 2019-04-03 ENCOUNTER — Other Ambulatory Visit (HOSPITAL_COMMUNITY)
Admission: RE | Admit: 2019-04-03 | Discharge: 2019-04-03 | Disposition: A | Discharge status: Home / Self Care | Source: Ambulatory Visit | Attending: Obstetrics and Gynecology | Admitting: Obstetrics and Gynecology

## 2019-04-03 ENCOUNTER — Encounter (HOSPITAL_BASED_OUTPATIENT_CLINIC_OR_DEPARTMENT_OTHER): Payer: Self-pay | Admitting: Obstetrics and Gynecology

## 2019-04-03 DIAGNOSIS — Z01812 Encounter for preprocedural laboratory examination: Secondary | ICD-10-CM | POA: Diagnosis present

## 2019-04-03 DIAGNOSIS — Z20822 Contact with and (suspected) exposure to covid-19: Secondary | ICD-10-CM | POA: Insufficient documentation

## 2019-04-03 LAB — SARS CORONAVIRUS 2 (TAT 6-24 HRS): SARS Coronavirus 2: NEGATIVE

## 2019-04-03 NOTE — Progress Notes (Signed)
Spoke w/ via phone for pre-op interview--- PT Lab needs dos----  CBC             Lab results------ no COVID test ------ 04-03-2019 @ 1155 Arrive at ------- 0645 NPO after ------ MN Medications to take morning of surgery ----- NONE Diabetic medication ----- n/a Patient Special Instructions ----- n/a Pre-Op special Istructions ----- n/a Patient verbalized understanding of instructions that were given at this phone interview. Patient denies shortness of breath, chest pain, fever, cough a this phone interview.

## 2019-04-04 NOTE — Anesthesia Preprocedure Evaluation (Addendum)
Anesthesia Evaluation  Patient identified by MRN, date of birth, ID band Patient awake    Reviewed: Allergy & Precautions, NPO status , Patient's Chart, lab work & pertinent test results  History of Anesthesia Complications Negative for: history of anesthetic complications  Airway Mallampati: I  TM Distance: >3 FB Neck ROM: Full    Dental no notable dental hx.    Pulmonary neg pulmonary ROS,    Pulmonary exam normal        Cardiovascular negative cardio ROS Normal cardiovascular exam     Neuro/Psych Depression negative neurological ROS     GI/Hepatic negative GI ROS, Neg liver ROS,   Endo/Other  negative endocrine ROS  Renal/GU negative Renal ROS  negative genitourinary   Musculoskeletal negative musculoskeletal ROS (+)   Abdominal   Peds  Hematology negative hematology ROS (+)   Anesthesia Other Findings Day of surgery medications reviewed with patient.  Reproductive/Obstetrics Missed ab                            Anesthesia Physical Anesthesia Plan  ASA: I  Anesthesia Plan: General   Post-op Pain Management:    Induction: Intravenous  PONV Risk Score and Plan: 4 or greater and Midazolam, Ondansetron, Dexamethasone and Treatment may vary due to age or medical condition  Airway Management Planned: LMA  Additional Equipment: None  Intra-op Plan:   Post-operative Plan: Extubation in OR  Informed Consent: I have reviewed the patients History and Physical, chart, labs and discussed the procedure including the risks, benefits and alternatives for the proposed anesthesia with the patient or authorized representative who has indicated his/her understanding and acceptance.     Dental advisory given  Plan Discussed with: CRNA  Anesthesia Plan Comments:        Anesthesia Quick Evaluation

## 2019-04-05 NOTE — H&P (Signed)
[name] is an 24 y.o. female. She was seen for a routine OB vist on 3-16, 13 weeks by dates.  Unable to hear FHT, u/s confirmed 9 week missed ab.  Options discussed, she wants D&E.  Pertinent Gynecological History: Last pap: normal Date: 11/2018 OB History: G3, P1021   Menstrual History: No LMP recorded (lmp unknown). Patient is pregnant.    Past Medical History:  Diagnosis Date  . Depression   . MDD (major depressive disorder)   . Missed ab   . Wears glasses     Past Surgical History:  Procedure Laterality Date  . KNEE ARTHROSCOPY Right 2011  . TONSILLECTOMY AND ADENOIDECTOMY  2000  . WISDOM TOOTH EXTRACTION  2019    Family History  Problem Relation Age of Onset  . Diabetes Maternal Grandmother   . Arthritis Maternal Grandmother   . Diabetes Paternal Grandmother   . Hypertension Mother   . Healthy Father     Social History:  reports that she has never smoked. She has never used smokeless tobacco. She reports previous alcohol use. She reports that she does not use drugs.  Allergies: No Known Allergies  No medications prior to admission.    Review of Systems  Respiratory: Negative.   Cardiovascular: Negative.     Height 5\' 5"  (1.651 m), weight 68 kg, not currently breastfeeding. Physical Exam  Constitutional: She appears well-developed and well-nourished.  Neck: No thyromegaly present.  Cardiovascular: Normal rate, regular rhythm and normal heart sounds.  No murmur heard. Respiratory: Effort normal and breath sounds normal. No respiratory distress. She has no wheezes.  GI: Soft. There is no abdominal tenderness.  Musculoskeletal:     Cervical back: Neck supple.    No results found for this or any previous visit (from the past 24 hour(s)).  No results found.  Assessment/Plan: Missed ab at 9 weeks.  Discussed options, she opts for D&E.  Discussed procedure and risks, will admit for D&E  Zed Wanninger 04/05/2019, 10:14 AM

## 2019-04-07 ENCOUNTER — Ambulatory Visit (HOSPITAL_BASED_OUTPATIENT_CLINIC_OR_DEPARTMENT_OTHER)
Admission: RE | Admit: 2019-04-07 | Discharge: 2019-04-07 | Disposition: A | Discharge status: Home / Self Care | Attending: Obstetrics and Gynecology | Admitting: Obstetrics and Gynecology

## 2019-04-07 ENCOUNTER — Ambulatory Visit (HOSPITAL_BASED_OUTPATIENT_CLINIC_OR_DEPARTMENT_OTHER): Admitting: Anesthesiology

## 2019-04-07 ENCOUNTER — Encounter (HOSPITAL_BASED_OUTPATIENT_CLINIC_OR_DEPARTMENT_OTHER): Payer: Self-pay | Admitting: Obstetrics and Gynecology

## 2019-04-07 ENCOUNTER — Other Ambulatory Visit: Payer: Self-pay

## 2019-04-07 ENCOUNTER — Encounter (HOSPITAL_BASED_OUTPATIENT_CLINIC_OR_DEPARTMENT_OTHER)
Admission: RE | Disposition: A | Discharge status: Home / Self Care | Payer: Self-pay | Source: Home / Self Care | Attending: Obstetrics and Gynecology

## 2019-04-07 DIAGNOSIS — F329 Major depressive disorder, single episode, unspecified: Secondary | ICD-10-CM | POA: Diagnosis not present

## 2019-04-07 DIAGNOSIS — O021 Missed abortion: Secondary | ICD-10-CM | POA: Diagnosis present

## 2019-04-07 DIAGNOSIS — Z8249 Family history of ischemic heart disease and other diseases of the circulatory system: Secondary | ICD-10-CM | POA: Insufficient documentation

## 2019-04-07 DIAGNOSIS — Z3A09 9 weeks gestation of pregnancy: Secondary | ICD-10-CM | POA: Insufficient documentation

## 2019-04-07 DIAGNOSIS — Z833 Family history of diabetes mellitus: Secondary | ICD-10-CM | POA: Insufficient documentation

## 2019-04-07 DIAGNOSIS — Z8261 Family history of arthritis: Secondary | ICD-10-CM | POA: Insufficient documentation

## 2019-04-07 DIAGNOSIS — O99341 Other mental disorders complicating pregnancy, first trimester: Secondary | ICD-10-CM | POA: Insufficient documentation

## 2019-04-07 HISTORY — DX: Presence of spectacles and contact lenses: Z97.3

## 2019-04-07 HISTORY — DX: Missed abortion: O02.1

## 2019-04-07 HISTORY — DX: Major depressive disorder, single episode, unspecified: F32.9

## 2019-04-07 HISTORY — PX: DILATION AND EVACUATION: SHX1459

## 2019-04-07 LAB — CBC
HCT: 39.6 % (ref 36.0–46.0)
Hemoglobin: 12.7 g/dL (ref 12.0–15.0)
MCH: 28.9 pg (ref 26.0–34.0)
MCHC: 32.1 g/dL (ref 30.0–36.0)
MCV: 90.2 fL (ref 80.0–100.0)
Platelets: 243 10*3/uL (ref 150–400)
RBC: 4.39 MIL/uL (ref 3.87–5.11)
RDW: 12.9 % (ref 11.5–15.5)
WBC: 4.6 10*3/uL (ref 4.0–10.5)
nRBC: 0 % (ref 0.0–0.2)

## 2019-04-07 SURGERY — DILATION AND EVACUATION, UTERUS
Anesthesia: General | Site: Uterus

## 2019-04-07 MED ORDER — MIDAZOLAM HCL 2 MG/2ML IJ SOLN
INTRAMUSCULAR | Status: AC
  Filled 2019-04-07: qty 2

## 2019-04-07 MED ORDER — OXYCODONE HCL 5 MG PO TABS
5.0000 mg | ORAL_TABLET | Freq: Once | ORAL | Status: DC | PRN
  Filled 2019-04-07: qty 1

## 2019-04-07 MED ORDER — MIDAZOLAM HCL 5 MG/5ML IJ SOLN
INTRAMUSCULAR | Status: DC | PRN
  Administered 2019-04-07: 2 mg via INTRAVENOUS

## 2019-04-07 MED ORDER — LIDOCAINE 2% (20 MG/ML) 5 ML SYRINGE
INTRAMUSCULAR | Status: AC
  Filled 2019-04-07: qty 5

## 2019-04-07 MED ORDER — FENTANYL CITRATE (PF) 100 MCG/2ML IJ SOLN
25.0000 ug | INTRAMUSCULAR | Status: DC | PRN
  Filled 2019-04-07: qty 1

## 2019-04-07 MED ORDER — DEXAMETHASONE SODIUM PHOSPHATE 10 MG/ML IJ SOLN
INTRAMUSCULAR | Status: AC
  Filled 2019-04-07: qty 1

## 2019-04-07 MED ORDER — LACTATED RINGERS IV SOLN
INTRAVENOUS | Status: DC
  Filled 2019-04-07: qty 1000

## 2019-04-07 MED ORDER — 0.9 % SODIUM CHLORIDE (POUR BTL) OPTIME: TOPICAL | Status: DC | PRN

## 2019-04-07 MED ORDER — ONDANSETRON HCL 4 MG/2ML IJ SOLN
INTRAMUSCULAR | Status: AC
  Filled 2019-04-07: qty 2

## 2019-04-07 MED ORDER — KETOROLAC TROMETHAMINE 30 MG/ML IJ SOLN
INTRAMUSCULAR | Status: DC | PRN
  Administered 2019-04-07: 30 mg via INTRAVENOUS

## 2019-04-07 MED ORDER — ACETAMINOPHEN 500 MG PO TABS
ORAL_TABLET | ORAL | Status: AC
  Filled 2019-04-07: qty 2

## 2019-04-07 MED ORDER — SODIUM CHLORIDE 0.9 % IV SOLN
100.0000 mg | Freq: Once | INTRAVENOUS | Status: AC
  Administered 2019-04-07: 100 mg via INTRAVENOUS
  Filled 2019-04-07 (×2): qty 100

## 2019-04-07 MED ORDER — ONDANSETRON HCL 4 MG/2ML IJ SOLN
INTRAMUSCULAR | Status: DC | PRN
  Administered 2019-04-07: 4 mg via INTRAVENOUS

## 2019-04-07 MED ORDER — LIDOCAINE 2% (20 MG/ML) 5 ML SYRINGE
INTRAMUSCULAR | Status: DC | PRN
  Administered 2019-04-07: 80 mg via INTRAVENOUS

## 2019-04-07 MED ORDER — FENTANYL CITRATE (PF) 100 MCG/2ML IJ SOLN
INTRAMUSCULAR | Status: DC | PRN
  Administered 2019-04-07 (×4): 25 ug via INTRAVENOUS

## 2019-04-07 MED ORDER — STERILE WATER FOR IRRIGATION IR SOLN
Status: DC | PRN
  Administered 2019-04-07: 500 mL

## 2019-04-07 MED ORDER — DEXAMETHASONE SODIUM PHOSPHATE 10 MG/ML IJ SOLN
INTRAMUSCULAR | Status: DC | PRN
  Administered 2019-04-07: 5 mg via INTRAVENOUS

## 2019-04-07 MED ORDER — PROMETHAZINE HCL 25 MG/ML IJ SOLN
6.2500 mg | INTRAMUSCULAR | Status: DC | PRN
  Filled 2019-04-07: qty 1

## 2019-04-07 MED ORDER — LIDOCAINE HCL 2 % IJ SOLN
INTRAMUSCULAR | Status: DC | PRN
  Administered 2019-04-07: 16 mL

## 2019-04-07 MED ORDER — ACETAMINOPHEN 500 MG PO TABS
1000.0000 mg | ORAL_TABLET | Freq: Once | ORAL | Status: AC
  Administered 2019-04-07: 1000 mg via ORAL
  Filled 2019-04-07: qty 2

## 2019-04-07 MED ORDER — FENTANYL CITRATE (PF) 100 MCG/2ML IJ SOLN
INTRAMUSCULAR | Status: AC
  Filled 2019-04-07: qty 2

## 2019-04-07 MED ORDER — PROPOFOL 10 MG/ML IV BOLUS
INTRAVENOUS | Status: AC
  Filled 2019-04-07: qty 20

## 2019-04-07 MED ORDER — PROPOFOL 10 MG/ML IV BOLUS
INTRAVENOUS | Status: DC | PRN
  Administered 2019-04-07: 30 mg via INTRAVENOUS
  Administered 2019-04-07: 140 mg via INTRAVENOUS
  Administered 2019-04-07: 30 mg via INTRAVENOUS

## 2019-04-07 MED ORDER — OXYCODONE HCL 5 MG/5ML PO SOLN
5.0000 mg | Freq: Once | ORAL | Status: DC | PRN
  Filled 2019-04-07: qty 5

## 2019-04-07 SURGICAL SUPPLY — 28 items
BAG DRN RND TRDRP ANRFLXCHMBR (UROLOGICAL SUPPLIES)
BAG URINE DRAIN 2000ML AR STRL (UROLOGICAL SUPPLIES) IMPLANT
CATH FOLEY 2WAY SLVR 30CC 16FR (CATHETERS) IMPLANT
CATH ROBINSON RED A/P 16FR (CATHETERS) ×3 IMPLANT
COVER WAND RF STERILE (DRAPES) ×3 IMPLANT
FILTER UTR ASPR ASSEMBLY (MISCELLANEOUS) ×3 IMPLANT
GLOVE ORTHO TXT STRL SZ7.5 (GLOVE) ×3 IMPLANT
HOSE CONNECTING 18IN BERKELEY (TUBING) ×3 IMPLANT
KIT BERKELEY 1ST TRI 3/8 NO TR (MISCELLANEOUS) ×3 IMPLANT
KIT BERKELEY 1ST TRIMESTER 3/8 (MISCELLANEOUS) ×6 IMPLANT
KIT TURNOVER CYSTO (KITS) ×3 IMPLANT
NS IRRIG 500ML POUR BTL (IV SOLUTION) IMPLANT
PACK VAGINAL MINOR WOMEN LF (CUSTOM PROCEDURE TRAY) ×3 IMPLANT
PAD OB MATERNITY 4.3X12.25 (PERSONAL CARE ITEMS) IMPLANT
PAD PREP 24X48 CUFFED NSTRL (MISCELLANEOUS) ×3 IMPLANT
SCOPETTES 8  STERILE (MISCELLANEOUS)
SCOPETTES 8 STERILE (MISCELLANEOUS) IMPLANT
SET BERKELEY SUCTION TUBING (SUCTIONS) ×3 IMPLANT
SYR 30ML LL (SYRINGE) IMPLANT
TOWEL OR 17X26 10 PK STRL BLUE (TOWEL DISPOSABLE) ×3 IMPLANT
TRAP TISSUE FILTER (MISCELLANEOUS) ×6 IMPLANT
TUBE CONNECTING 12'X1/4 (SUCTIONS)
TUBE CONNECTING 12X1/4 (SUCTIONS) IMPLANT
VACURETTE 10 RIGID CVD (CANNULA) IMPLANT
VACURETTE 7MM CVD STRL WRAP (CANNULA) IMPLANT
VACURETTE 8 RIGID CVD (CANNULA) IMPLANT
VACURETTE 9 RIGID CVD (CANNULA) IMPLANT
WATER STERILE IRR 500ML POUR (IV SOLUTION) ×3 IMPLANT

## 2019-04-07 NOTE — Anesthesia Procedure Notes (Signed)
Procedure Name: LMA Insertion Date/Time: 04/07/2019 8:47 AM Performed by: Jessica Priest, CRNA Pre-anesthesia Checklist: Patient identified, Emergency Drugs available, Suction available, Patient being monitored and Timeout performed Patient Re-evaluated:Patient Re-evaluated prior to induction Oxygen Delivery Method: Circle system utilized Preoxygenation: Pre-oxygenation with 100% oxygen Induction Type: IV induction Ventilation: Mask ventilation without difficulty LMA: LMA inserted LMA Size: 4.0 Number of attempts: 1 Airway Equipment and Method: Bite block Placement Confirmation: positive ETCO2,  CO2 detector and breath sounds checked- equal and bilateral Tube secured with: Tape Dental Injury: Teeth and Oropharynx as per pre-operative assessment

## 2019-04-07 NOTE — Transfer of Care (Signed)
Immediate Anesthesia Transfer of Care Note  Patient: [name]  Procedure(s) Performed: Procedure(s) (LRB): DILATATION AND EVACUATION (N/A)  Patient Location: PACU  Anesthesia Type: General  Level of Consciousness: awake, sedated, patient cooperative and responds to stimulation  Airway & Oxygen Therapy: Patient Spontanous Breathing and Patient connected to Home 02 and soft FM  Post-op Assessment: Report given to PACU RN, Post -op Vital signs reviewed and stable and Patient moving all extremities  Post vital signs: Reviewed and stable  Complications: No apparent anesthesia complications

## 2019-04-07 NOTE — Anesthesia Postprocedure Evaluation (Signed)
Anesthesia Post Note  Patient: [name]  Procedure(s) Performed: DILATATION AND EVACUATION (N/A Uterus)     Patient location during evaluation: PACU Anesthesia Type: General Level of consciousness: awake and alert and oriented Pain management: pain level controlled Vital Signs Assessment: post-procedure vital signs reviewed and stable Respiratory status: spontaneous breathing, nonlabored ventilation and respiratory function stable Cardiovascular status: blood pressure returned to baseline Postop Assessment: no apparent nausea or vomiting Anesthetic complications: no    Last Vitals:  Vitals:   04/07/19 0711 04/07/19 0915  BP: 105/63 99/64  Pulse:  71  Resp: 14 13  Temp: 36.9 C 36.5 C  SpO2: 100% 98%    Last Pain:  Vitals:   04/07/19 0915  PainSc: Asleep                 Kaylyn Layer

## 2019-04-07 NOTE — Interval H&P Note (Signed)
History and Physical Interval Note:  04/07/2019 8:39 AM  [name]  has presented today for surgery, with the diagnosis of MISCARRIAGE.  The various methods of treatment have been discussed with the patient and family. After consideration of risks, benefits and other options for treatment, the patient has consented to  Procedure(s): DILATATION AND EVACUATION (N/A) as a surgical intervention.  The patient's history has been reviewed, patient examined, no change in status, stable for surgery.  I have reviewed the patient's chart and labs.  Questions were answered to the patient's satisfaction.     Leighton Roach Trevar Boehringer

## 2019-04-07 NOTE — Discharge Instructions (Signed)
Routine instructions for D&E   DISCHARGE INSTRUCTIONS: D&C / D&E The following instructions have been prepared to help you care for yourself upon your return home.   Personal hygiene: Marland Kitchen Use sanitary pads for vaginal drainage, not tampons. . Shower the day after your procedure. . NO tub baths, pools or Jacuzzis for 2-3 weeks. . Wipe front to back after using the bathroom.  Activity and limitations: . Do NOT drive or operate any equipment for 24 hours. The effects of anesthesia are still present and drowsiness may result. . Do NOT rest in bed all day. . Walking is encouraged. . Walk up and down stairs slowly. . You may resume your normal activity in one to two days or as indicated by your physician.  Sexual activity: NO intercourse for at least 2 weeks after the procedure, or as indicated by your physician.  Diet: Eat a light meal as desired this evening. You may resume your usual diet tomorrow.  Return to work: You may resume your work activities in one to two days or as indicated by your doctor.  What to expect after your surgery: Expect to have vaginal bleeding/discharge for 2-3 days and spotting for up to 10 days. It is not unusual to have soreness for up to 1-2 weeks. You may have a slight burning sensation when you urinate for the first day. Mild cramps may continue for a couple of days. You may have a regular period in 2-6 weeks.  Call your doctor for any of the following: . Excessive vaginal bleeding, saturating and changing one pad every hour. . Inability to urinate 6 hours after discharge from hospital. . Pain not relieved by pain medication. . Fever of 100.4 F or greater. . Unusual vaginal discharge or odor.   Post Anesthesia Home Care Instructions  Activity: Get plenty of rest for the remainder of the day. A responsible individual must stay with you for 24 hours following the procedure.  For the next 24 hours, DO NOT: -Drive a car -Advertising copywriter -Drink  alcoholic beverages -Take any medication unless instructed by your physician -Make any legal decisions or sign important papers.  Meals: Start with liquid foods such as gelatin or soup. Progress to regular foods as tolerated. Avoid greasy, spicy, heavy foods. If nausea and/or vomiting occur, drink only clear liquids until the nausea and/or vomiting subsides. Call your physician if vomiting continues.  Special Instructions/Symptoms: Your throat may feel dry or sore from the anesthesia or the breathing tube placed in your throat during surgery. If this causes discomfort, gargle with warm salt water. The discomfort should disappear within 24 hours.  If you had a scopolamine patch placed behind your ear for the management of post- operative nausea and/or vomiting:  1. The medication in the patch is effective for 72 hours, after which it should be removed.  Wrap patch in a tissue and discard in the trash. Wash hands thoroughly with soap and water. 2. You may remove the patch earlier than 72 hours if you experience unpleasant side effects which may include dry mouth, dizziness or visual disturbances. 3. Avoid touching the patch. Wash your hands with soap and water after contact with the patch.      NO IBUPROFEN PRODUCTS (MOTRIN, ADVIL) OR ALEVE UNTIL 3:00PM TODAY.

## 2019-04-07 NOTE — Op Note (Signed)
  Preoperative Diagnosis:  Missed abortion Postop Diagnosis:  Same Procedure:  D&E Anesthesia:  Gen with LMA, paracervical block Findings: Cervix was closed, uterus was slightly enlarged, abundant products of conception were obtained Specimens: Products of conception sent for routine pathology Estimated blood loss: 50cc Complications: None  Procedure in detail: The patient was taken to the operating room and placed in the dorsosupine position. General anesthesia was induced and she was placed in mobile stirrups. Perineum and vagina were prepped and draped in the usual sterile fashion, bladder drained with a red Robinson catheter. A Graves speculum was inserted in the vagina. The anterior lip of the cervix was grasped with a single-tooth tenaculum. Paracervical block was then performed with a total of 16 cc of 2% plain lidocaine. Uterus then sounded to 9 cm. Cervix was gradually easily dilated to size 25 dilator. A size 10 curved suction curet was then inserted without difficulty. Suction curettage was return was performed with return of products of conception. Sharp curettage was performed with which revealed good uterine cry in all quadrants and no significant tissue. Suction curettage was performed one more time which revealed minimal blood. The single-tooth tenaculum was removed from the cervix and bleeding was controlled with pressure. All instruments were removed from the vagina. The patient was taken to the recovery in stable condition after tolerating the procedure well. Counts were correct, she received doxycycline at the beginning of the procedure and had PAS hose on throughout the procedure.

## 2019-04-08 LAB — SURGICAL PATHOLOGY

## 2019-07-14 LAB — OB RESULTS CONSOLE GC/CHLAMYDIA
Chlamydia: NEGATIVE
Chlamydia: NEGATIVE
Chlamydia: NEGATIVE
Chlamydia: NEGATIVE
Chlamydia: NEGATIVE
Chlamydia: NEGATIVE
Gonorrhea: NEGATIVE
Gonorrhea: NEGATIVE
Gonorrhea: NEGATIVE
Gonorrhea: NEGATIVE
Gonorrhea: NEGATIVE
Gonorrhea: NEGATIVE

## 2019-07-14 LAB — OB RESULTS CONSOLE HIV ANTIBODY (ROUTINE TESTING)
HIV: NONREACTIVE
HIV: NONREACTIVE
HIV: NONREACTIVE
HIV: NONREACTIVE
HIV: NONREACTIVE
HIV: NONREACTIVE

## 2019-07-14 LAB — OB RESULTS CONSOLE HEPATITIS B SURFACE ANTIGEN
Hepatitis B Surface Ag: NEGATIVE
Hepatitis B Surface Ag: NEGATIVE
Hepatitis B Surface Ag: NEGATIVE
Hepatitis B Surface Ag: NEGATIVE
Hepatitis B Surface Ag: NEGATIVE
Hepatitis B Surface Ag: NEGATIVE

## 2019-07-14 LAB — OB RESULTS CONSOLE ABO/RH: RH Type: POSITIVE

## 2019-07-14 LAB — OB RESULTS CONSOLE RUBELLA ANTIBODY, IGM
Rubella: IMMUNE
Rubella: IMMUNE
Rubella: IMMUNE
Rubella: IMMUNE
Rubella: IMMUNE
Rubella: IMMUNE

## 2019-07-14 LAB — OB RESULTS CONSOLE RPR
RPR: NONREACTIVE
RPR: NONREACTIVE
RPR: NONREACTIVE
RPR: NONREACTIVE
RPR: NONREACTIVE

## 2019-07-14 LAB — OB RESULTS CONSOLE ANTIBODY SCREEN: Antibody Screen: NEGATIVE

## 2019-10-30 DIAGNOSIS — Z23 Encounter for immunization: Secondary | ICD-10-CM | POA: Diagnosis not present

## 2019-10-30 DIAGNOSIS — Z3480 Encounter for supervision of other normal pregnancy, unspecified trimester: Secondary | ICD-10-CM | POA: Diagnosis not present

## 2019-11-26 DIAGNOSIS — O43123 Velamentous insertion of umbilical cord, third trimester: Secondary | ICD-10-CM | POA: Diagnosis not present

## 2019-12-10 DIAGNOSIS — Z369 Encounter for antenatal screening, unspecified: Secondary | ICD-10-CM | POA: Diagnosis not present

## 2019-12-24 DIAGNOSIS — Z369 Encounter for antenatal screening, unspecified: Secondary | ICD-10-CM | POA: Diagnosis not present

## 2019-12-25 DIAGNOSIS — Z3483 Encounter for supervision of other normal pregnancy, third trimester: Secondary | ICD-10-CM | POA: Diagnosis not present

## 2019-12-25 DIAGNOSIS — Z3482 Encounter for supervision of other normal pregnancy, second trimester: Secondary | ICD-10-CM | POA: Diagnosis not present

## 2019-12-31 DIAGNOSIS — Z3483 Encounter for supervision of other normal pregnancy, third trimester: Secondary | ICD-10-CM | POA: Diagnosis not present

## 2019-12-31 DIAGNOSIS — Z3480 Encounter for supervision of other normal pregnancy, unspecified trimester: Secondary | ICD-10-CM | POA: Diagnosis not present

## 2019-12-31 DIAGNOSIS — Z369 Encounter for antenatal screening, unspecified: Secondary | ICD-10-CM | POA: Diagnosis not present

## 2019-12-31 LAB — OB RESULTS CONSOLE GC/CHLAMYDIA
Chlamydia: NEGATIVE
Gonorrhea: NEGATIVE

## 2019-12-31 LAB — OB RESULTS CONSOLE GBS: GBS: NEGATIVE

## 2020-01-01 DIAGNOSIS — M5489 Other dorsalgia: Secondary | ICD-10-CM | POA: Diagnosis not present

## 2020-01-07 DIAGNOSIS — Z01419 Encounter for gynecological examination (general) (routine) without abnormal findings: Secondary | ICD-10-CM | POA: Diagnosis not present

## 2020-01-14 DIAGNOSIS — Z20822 Contact with and (suspected) exposure to covid-19: Secondary | ICD-10-CM | POA: Diagnosis not present

## 2020-01-14 DIAGNOSIS — Z369 Encounter for antenatal screening, unspecified: Secondary | ICD-10-CM | POA: Diagnosis not present

## 2020-01-17 NOTE — L&D Delivery Note (Signed)
Patient was C/C/+2 and pushed for approx 20 minutes with epidural.    NSVD female infant, Apgars 8/9, weight pending.   The patient had no laceration. Fundus was firm. EBL was expected amount. Placenta was delivered intact. Vagina was clear.  Delayed cord clamping done for 30-60 seconds while warming baby. Baby was vigorous and doing skin to skin with mother.  [name]

## 2020-01-23 ENCOUNTER — Telehealth (HOSPITAL_COMMUNITY): Payer: Self-pay | Admitting: *Deleted

## 2020-01-23 NOTE — Telephone Encounter (Signed)
Preadmission screen  

## 2020-01-26 ENCOUNTER — Telehealth (HOSPITAL_COMMUNITY): Payer: Self-pay | Admitting: *Deleted

## 2020-01-26 ENCOUNTER — Encounter (HOSPITAL_COMMUNITY): Payer: Self-pay | Admitting: *Deleted

## 2020-01-26 ENCOUNTER — Other Ambulatory Visit (HOSPITAL_COMMUNITY)
Admission: RE | Admit: 2020-01-26 | Discharge: 2020-01-26 | Disposition: A | Discharge status: Home / Self Care | Payer: 59 | Source: Ambulatory Visit | Attending: Obstetrics and Gynecology | Admitting: Obstetrics and Gynecology

## 2020-01-26 DIAGNOSIS — O9832 Other infections with a predominantly sexual mode of transmission complicating childbirth: Secondary | ICD-10-CM | POA: Diagnosis not present

## 2020-01-26 DIAGNOSIS — Z3A40 40 weeks gestation of pregnancy: Secondary | ICD-10-CM | POA: Diagnosis not present

## 2020-01-26 DIAGNOSIS — Z20822 Contact with and (suspected) exposure to covid-19: Secondary | ICD-10-CM | POA: Insufficient documentation

## 2020-01-26 DIAGNOSIS — Z01812 Encounter for preprocedural laboratory examination: Secondary | ICD-10-CM | POA: Insufficient documentation

## 2020-01-26 DIAGNOSIS — A6000 Herpesviral infection of urogenital system, unspecified: Secondary | ICD-10-CM | POA: Diagnosis not present

## 2020-01-26 LAB — SARS CORONAVIRUS 2 (TAT 6-24 HRS): SARS Coronavirus 2: NEGATIVE

## 2020-01-26 NOTE — Telephone Encounter (Signed)
Preadmission screen  

## 2020-01-27 ENCOUNTER — Other Ambulatory Visit (HOSPITAL_COMMUNITY): Payer: Self-pay

## 2020-01-28 ENCOUNTER — Inpatient Hospital Stay (HOSPITAL_COMMUNITY): Payer: 59 | Admitting: Anesthesiology

## 2020-01-28 ENCOUNTER — Inpatient Hospital Stay (HOSPITAL_COMMUNITY): Payer: 59

## 2020-01-28 ENCOUNTER — Inpatient Hospital Stay (HOSPITAL_COMMUNITY)
Admission: AD | Admit: 2020-01-28 | Discharge: 2020-01-30 | DRG: 807 | Disposition: A | Discharge status: Home / Self Care | Payer: 59 | Attending: Obstetrics and Gynecology | Admitting: Obstetrics and Gynecology

## 2020-01-28 ENCOUNTER — Encounter (HOSPITAL_COMMUNITY): Payer: Self-pay | Admitting: Obstetrics and Gynecology

## 2020-01-28 ENCOUNTER — Other Ambulatory Visit: Payer: Self-pay

## 2020-01-28 DIAGNOSIS — Z20822 Contact with and (suspected) exposure to covid-19: Secondary | ICD-10-CM | POA: Diagnosis present

## 2020-01-28 DIAGNOSIS — O9832 Other infections with a predominantly sexual mode of transmission complicating childbirth: Secondary | ICD-10-CM | POA: Diagnosis present

## 2020-01-28 DIAGNOSIS — Z3490 Encounter for supervision of normal pregnancy, unspecified, unspecified trimester: Secondary | ICD-10-CM

## 2020-01-28 DIAGNOSIS — Z3A40 40 weeks gestation of pregnancy: Secondary | ICD-10-CM | POA: Diagnosis not present

## 2020-01-28 DIAGNOSIS — A6000 Herpesviral infection of urogenital system, unspecified: Secondary | ICD-10-CM | POA: Diagnosis present

## 2020-01-28 DIAGNOSIS — O26893 Other specified pregnancy related conditions, third trimester: Secondary | ICD-10-CM | POA: Diagnosis present

## 2020-01-28 LAB — CBC
HCT: 39.8 % (ref 36.0–46.0)
Hemoglobin: 12.8 g/dL (ref 12.0–15.0)
MCH: 28.6 pg (ref 26.0–34.0)
MCHC: 32.2 g/dL (ref 30.0–36.0)
MCV: 88.8 fL (ref 80.0–100.0)
Platelets: 194 10*3/uL (ref 150–400)
RBC: 4.48 MIL/uL (ref 3.87–5.11)
RDW: 13.8 % (ref 11.5–15.5)
WBC: 6.8 10*3/uL (ref 4.0–10.5)
nRBC: 0 % (ref 0.0–0.2)

## 2020-01-28 LAB — RPR: RPR Ser Ql: NONREACTIVE

## 2020-01-28 LAB — TYPE AND SCREEN
ABO/RH(D): AB POS
Antibody Screen: NEGATIVE

## 2020-01-28 MED ORDER — PHENYLEPHRINE 40 MCG/ML (10ML) SYRINGE FOR IV PUSH (FOR BLOOD PRESSURE SUPPORT): 80.0000 ug | PREFILLED_SYRINGE | INTRAVENOUS | Status: DC | PRN

## 2020-01-28 MED ORDER — LACTATED RINGERS IV SOLN: INTRAVENOUS | Status: DC

## 2020-01-28 MED ORDER — EPHEDRINE 5 MG/ML INJ: 10.0000 mg | INTRAVENOUS | Status: DC | PRN

## 2020-01-28 MED ORDER — SIMETHICONE 80 MG PO CHEW: 80.0000 mg | CHEWABLE_TABLET | ORAL | Status: DC | PRN

## 2020-01-28 MED ORDER — ONDANSETRON HCL 4 MG PO TABS: 4.0000 mg | ORAL_TABLET | ORAL | Status: DC | PRN

## 2020-01-28 MED ORDER — DIBUCAINE (PERIANAL) 1 % EX OINT: 1.0000 "application " | TOPICAL_OINTMENT | CUTANEOUS | Status: DC | PRN

## 2020-01-28 MED ORDER — OXYCODONE HCL 5 MG PO TABS: 5.0000 mg | ORAL_TABLET | ORAL | Status: DC | PRN

## 2020-01-28 MED ORDER — OXYTOCIN-SODIUM CHLORIDE 30-0.9 UT/500ML-% IV SOLN
1.0000 m[IU]/min | INTRAVENOUS | Status: DC
  Administered 2020-01-28: 2 m[IU]/min via INTRAVENOUS
  Filled 2020-01-28: qty 500

## 2020-01-28 MED ORDER — LACTATED RINGERS IV SOLN: 500.0000 mL | Freq: Once | INTRAVENOUS | Status: DC

## 2020-01-28 MED ORDER — OXYCODONE-ACETAMINOPHEN 5-325 MG PO TABS: 1.0000 | ORAL_TABLET | ORAL | Status: DC | PRN

## 2020-01-28 MED ORDER — ZOLPIDEM TARTRATE 5 MG PO TABS: 5.0000 mg | ORAL_TABLET | Freq: Every evening | ORAL | Status: DC | PRN

## 2020-01-28 MED ORDER — OXYTOCIN-SODIUM CHLORIDE 30-0.9 UT/500ML-% IV SOLN: 2.5000 [IU]/h | INTRAVENOUS | Status: DC

## 2020-01-28 MED ORDER — DIPHENHYDRAMINE HCL 50 MG/ML IJ SOLN: 12.5000 mg | INTRAMUSCULAR | Status: DC | PRN

## 2020-01-28 MED ORDER — SOD CITRATE-CITRIC ACID 500-334 MG/5ML PO SOLN: 30.0000 mL | ORAL | Status: DC | PRN

## 2020-01-28 MED ORDER — IBUPROFEN 600 MG PO TABS
600.0000 mg | ORAL_TABLET | Freq: Four times a day (QID) | ORAL | Status: DC
  Administered 2020-01-28 – 2020-01-30 (×7): 600 mg via ORAL
  Filled 2020-01-28 (×7): qty 1

## 2020-01-28 MED ORDER — ONDANSETRON HCL 4 MG/2ML IJ SOLN: 4.0000 mg | Freq: Four times a day (QID) | INTRAMUSCULAR | Status: DC | PRN

## 2020-01-28 MED ORDER — BENZOCAINE-MENTHOL 20-0.5 % EX AERO: 1.0000 "application " | INHALATION_SPRAY | CUTANEOUS | Status: DC | PRN

## 2020-01-28 MED ORDER — TERBUTALINE SULFATE 1 MG/ML IJ SOLN: 0.2500 mg | Freq: Once | INTRAMUSCULAR | Status: DC | PRN

## 2020-01-28 MED ORDER — LACTATED RINGERS IV SOLN: 500.0000 mL | INTRAVENOUS | Status: DC | PRN

## 2020-01-28 MED ORDER — SODIUM CHLORIDE (PF) 0.9 % IJ SOLN
INTRAMUSCULAR | Status: DC | PRN
  Administered 2020-01-28: 12 mL/h via EPIDURAL

## 2020-01-28 MED ORDER — FENTANYL-BUPIVACAINE-NACL 0.5-0.125-0.9 MG/250ML-% EP SOLN
12.0000 mL/h | EPIDURAL | Status: DC | PRN
  Filled 2020-01-28: qty 250

## 2020-01-28 MED ORDER — LIDOCAINE HCL (PF) 1 % IJ SOLN
INTRAMUSCULAR | Status: DC | PRN
  Administered 2020-01-28 (×2): 4 mL via EPIDURAL

## 2020-01-28 MED ORDER — LIDOCAINE HCL (PF) 1 % IJ SOLN: 30.0000 mL | INTRAMUSCULAR | Status: DC | PRN

## 2020-01-28 MED ORDER — OXYCODONE HCL 5 MG PO TABS: 10.0000 mg | ORAL_TABLET | ORAL | Status: DC | PRN

## 2020-01-28 MED ORDER — OXYTOCIN BOLUS FROM INFUSION
333.0000 mL | Freq: Once | INTRAVENOUS | Status: AC
  Administered 2020-01-28: 333 mL via INTRAVENOUS

## 2020-01-28 MED ORDER — DIPHENHYDRAMINE HCL 25 MG PO CAPS
25.0000 mg | ORAL_CAPSULE | Freq: Four times a day (QID) | ORAL | Status: DC | PRN
Start: 2020-01-28 — End: 2020-01-30

## 2020-01-28 MED ORDER — TETANUS-DIPHTH-ACELL PERTUSSIS 5-2.5-18.5 LF-MCG/0.5 IM SUSY: 0.5000 mL | PREFILLED_SYRINGE | Freq: Once | INTRAMUSCULAR | Status: DC

## 2020-01-28 MED ORDER — ONDANSETRON HCL 4 MG/2ML IJ SOLN: 4.0000 mg | INTRAMUSCULAR | Status: DC | PRN

## 2020-01-28 MED ORDER — ACETAMINOPHEN 325 MG PO TABS: 650.0000 mg | ORAL_TABLET | ORAL | Status: DC | PRN

## 2020-01-28 MED ORDER — OXYCODONE-ACETAMINOPHEN 5-325 MG PO TABS: 2.0000 | ORAL_TABLET | ORAL | Status: DC | PRN

## 2020-01-28 MED ORDER — PRENATAL MULTIVITAMIN CH
1.0000 | ORAL_TABLET | Freq: Every day | ORAL | Status: DC
  Administered 2020-01-29 – 2020-01-30 (×2): 1 via ORAL
  Filled 2020-01-28 (×2): qty 1

## 2020-01-28 MED ORDER — COCONUT OIL OIL: 1.0000 "application " | TOPICAL_OIL | Status: DC | PRN

## 2020-01-28 MED ORDER — WITCH HAZEL-GLYCERIN EX PADS: 1.0000 "application " | MEDICATED_PAD | CUTANEOUS | Status: DC | PRN

## 2020-01-28 MED ORDER — SENNOSIDES-DOCUSATE SODIUM 8.6-50 MG PO TABS
2.0000 | ORAL_TABLET | Freq: Every day | ORAL | Status: DC
  Administered 2020-01-30: 2 via ORAL
  Filled 2020-01-28 (×2): qty 2

## 2020-01-28 NOTE — H&P (Signed)
25 y.o. [redacted]w[redacted]d  G4P1021 comes in for term schedule IOL.  Otherwise has good fetal movement and no bleeding.  Past Medical History:  Diagnosis Date  . Depression   . MDD (major depressive disorder)   . Missed ab   . Wears glasses    genital HSV  Past Surgical History:  Procedure Laterality Date  . DILATION AND EVACUATION N/A 04/07/2019   Procedure: DILATATION AND EVACUATION;  Surgeon: Lavina Hamman, MD;  Location: [facility];  Service: Gynecology;  Laterality: N/A;  . KNEE ARTHROSCOPY Right 2011  . TONSILLECTOMY AND ADENOIDECTOMY  2000  . WISDOM TOOTH EXTRACTION  2019    OB History  Gravida Para Term Preterm AB Living  4 1 1   2 1   SAB IAB Ectopic Multiple Live Births  2       1    # Outcome Date GA Lbr Len/2nd Weight Sex Delivery Anes PTL Lv  4 Current           3 SAB 03/31/19 [redacted]w[redacted]d         2 SAB 08/28/18 [redacted]w[redacted]d         1 Term 07/30/13 [redacted]w[redacted]d 18:28 / 00:27 3170 g F Vag-Spont EPI  LIV    Social History   Socioeconomic History  . Marital status: Married    Spouse name: Not on file  . Number of children: Not on file  . Years of education: Not on file  . Highest education level: Not on file  Occupational History  . Not on file  Tobacco Use  . Smoking status: Never Smoker  . Smokeless tobacco: Never Used  Vaping Use  . Vaping Use: Former  Substance and Sexual Activity  . Alcohol use: Not Currently    Comment: occ  . Drug use: Never  . Sexual activity: Yes    Birth control/protection: None  Other Topics Concern  . Not on file  Social History Narrative  . Not on file   Social Determinants of Health   Financial Resource Strain: Not on file  Food Insecurity: Not on file  Transportation Needs: Not on file  Physical Activity: Not on file  Stress: Not on file  Social Connections: Not on file  Intimate Partner Violence: Not on file   Patient has no known allergies.    Prenatal Transfer Tool  Maternal Diabetes: No Genetic Screening:  Normal Maternal Ultrasounds/Referrals: Other: marginal cord insertion, EFW 31 weeks 52% Fetal Ultrasounds or other Referrals:  None Maternal Substance Abuse:  No Significant Maternal Medications:  Meds include: Other: valtrex Significant Maternal Lab Results: Group B Strep negative  Other PNC: genital HSV    Vitals:   01/28/20 0810  Weight: 82.9 kg  Height: 5\' 5"  (1.651 m)    Lungs/Cor:  NAD Abdomen:  soft, gravid Ex:  no cords, erythema SVE:  Pending assessment FHTs:  Pending assessment  Toco: pending assessment   A/P   Admit for scheduled IOL at 40.2  GBS Neg  Pitocin 2x2  Other routine care  03/27/20

## 2020-01-28 NOTE — Anesthesia Preprocedure Evaluation (Signed)
Anesthesia Evaluation  Patient identified by MRN, date of birth, ID band Patient awake    Reviewed: Allergy & Precautions, Patient's Chart, lab work & pertinent test results  Airway Mallampati: II  TM Distance: >3 FB Neck ROM: Full    Dental no notable dental hx. (+) Teeth Intact   Pulmonary neg pulmonary ROS,    Pulmonary exam normal breath sounds clear to auscultation       Cardiovascular negative cardio ROS Normal cardiovascular exam Rhythm:Regular Rate:Normal     Neuro/Psych PSYCHIATRIC DISORDERS Depression negative neurological ROS     GI/Hepatic Neg liver ROS, GERD  ,  Endo/Other  Obesity  Renal/GU negative Renal ROS  negative genitourinary   Musculoskeletal negative musculoskeletal ROS (+)   Abdominal (+) + obese,   Peds  Hematology negative hematology ROS (+)   Anesthesia Other Findings   Reproductive/Obstetrics (+) Pregnancy                             Anesthesia Physical Anesthesia Plan  ASA: II  Anesthesia Plan: Epidural   Post-op Pain Management:    Induction:   PONV Risk Score and Plan:   Airway Management Planned: Natural Airway  Additional Equipment:   Intra-op Plan:   Post-operative Plan:   Informed Consent: I have reviewed the patients History and Physical, chart, labs and discussed the procedure including the risks, benefits and alternatives for the proposed anesthesia with the patient or authorized representative who has indicated his/her understanding and acceptance.       Plan Discussed with: Anesthesiologist  Anesthesia Plan Comments:         Anesthesia Quick Evaluation

## 2020-01-28 NOTE — Lactation Note (Addendum)
This note was copied from a baby's chart. Lactation Consultation Note  Patient Name: [name] [id number] Date: 01/28/2020 Reason for consult: Follow-up assessment;Mother's request;Difficult latch;Term;1st time breastfeeding Age:26 hours  Mom feeding choice is to breast and bottle feed infant. Mom tried breastfeeding with first child but was unable to get infant to latch and stopped after 2 weeks. Mom is a [employer] employee and breast pump form filled out and breast pump provided to family. Mom also has a personal pump at home.   Visit shortened as Mom required care from the RN to get to the bathroom. LC returned to continue assessment and RN stated Mom declined further LC services at this time. LC spoke with RN stating infant did not latch in L and D. I also did not see a latch and will not be able to give a latch score. RN, Herbert Moors will assess a latch.    Plan 1. To feed based on cues 8-12 x in 24 hour period no more than 4 hours without an attempt.           2. Mom to offer both breasts and look for signs of milk transfer. If Mom is unable to get infant to latch, she can offer EBM via spoon prior to latching.             3. Breastfeeding supplementation guide provided to Mom based on hours after birth. Mom to paced bottle feed any EBM or Formula using slow flow nipple based on guidelines reviewed.               4. I and O sheet reviewed              5. LC brochure of inpatient and outpatient services reviewed.

## 2020-01-28 NOTE — Anesthesia Procedure Notes (Signed)
Epidural Patient location during procedure: OB Start time: 01/28/2020 12:07 PM End time: 01/28/2020 12:16 PM  Staffing Anesthesiologist: Mal Amabile, MD Performed: anesthesiologist   Preanesthetic Checklist Completed: patient identified, IV checked, site marked, risks and benefits discussed, surgical consent, monitors and equipment checked, pre-op evaluation and timeout performed  Epidural Patient position: sitting Prep: DuraPrep and site prepped and draped Patient monitoring: continuous pulse ox and blood pressure Approach: midline Location: L3-L4 Injection technique: LOR air  Needle:  Needle type: Tuohy  Needle gauge: 17 G Needle length: 9 cm and 9 Needle insertion depth: 6 cm Catheter type: closed end flexible Catheter size: 19 Gauge Catheter at skin depth: 11 cm Test dose: negative and Other  Assessment Events: blood not aspirated, injection not painful, no injection resistance, no paresthesia and negative IV test  Additional Notes Patient identified. Risks and benefits discussed including failed block, incomplete  Pain control, post dural puncture headache, nerve damage, paralysis, blood pressure Changes, nausea, vomiting, reactions to medications-both toxic and allergic and post Partum back pain. All questions were answered. Patient expressed understanding and wished to proceed. Sterile technique was used throughout procedure. Epidural site was Dressed with sterile barrier dressing. No paresthesias, signs of intravascular injection Or signs of intrathecal spread were encountered.  Patient was more comfortable after the epidural was dosed. Please see RN's note for documentation of vital signs and FHR which are stable. Reason for block:procedure for pain

## 2020-01-28 NOTE — Lactation Note (Signed)
This note was copied from a baby's chart. Lactation Consultation Note  Patient Name: [name] [id number] Date: 01/28/2020 Reason for consult: L&D Initial assessment;Mother's request;1st time breastfeeding;Term Age:26 hours  Infant is in Dad's arms. LC talked with RN and they attempted a latch but infant was too sleepy before LC arrival. LC went over with Mom how to do breast massage and hand expression. If infant is unable to latch, Mom can offer EBM via spoon or finger feeding.   Mom states she wants to try breastfeeding and this will be her first experience. LC went over the benefits of breastfeeding for both her and the baby.   All questions answered at the end of the visit.

## 2020-01-29 LAB — CBC
HCT: 36.2 % (ref 36.0–46.0)
Hemoglobin: 11.6 g/dL — ABNORMAL LOW (ref 12.0–15.0)
MCH: 28.4 pg (ref 26.0–34.0)
MCHC: 32 g/dL (ref 30.0–36.0)
MCV: 88.5 fL (ref 80.0–100.0)
Platelets: 184 10*3/uL (ref 150–400)
RBC: 4.09 MIL/uL (ref 3.87–5.11)
RDW: 14 % (ref 11.5–15.5)
WBC: 13.1 10*3/uL — ABNORMAL HIGH (ref 4.0–10.5)
nRBC: 0 % (ref 0.0–0.2)

## 2020-01-29 NOTE — Progress Notes (Signed)
Patient is doing well.  She is ambulating, voiding, tolerating PO.  Pain control is good.  Lochia is appropriate  Vitals:   01/28/20 2020 01/28/20 2120 01/29/20 0125 01/29/20 0500  BP: 122/72 129/87 106/62 100/73  Pulse: (!) 52 61 72 66  Resp: 18 18 18 17   Temp: 98.2 F (36.8 C) 98.5 F (36.9 C) 98.4 F (36.9 C) 97.7 F (36.5 C)  TempSrc: Oral Oral Oral Oral  SpO2: 100% 98% 98% 99%  Weight:      Height:        NAD Fundus firm   Lab Results  Component Value Date   WBC 13.1 (H) 01/29/2020   HGB 11.6 (L) 01/29/2020   HCT 36.2 01/29/2020   MCV 88.5 01/29/2020   PLT 184 01/29/2020    --/--/AB POS (01/12 0840)/RImmune  A/P 24 y.o. 06-25-2000 PPD#1. Routine care.   PM delivery yesterday evening--desires discharge tomorrow.    [facility]

## 2020-01-29 NOTE — Social Work (Signed)
CSW received consult for hx of Depression.  CSW met with MOB to offer support and complete assessment.     CSW introduced self and role. CSW observed FOB bedside. Baby was in circumcision procedure. CSW requested to speak with MOB alone, MOB stated FOB could remain in room. CSW informed MOB of reason for consult. MOB expressed understanding and reported being diagnosed with depression in 2020. MOB stated she had a good pregnancy with no depressive symptoms. MOB has never been on medications and attended therapy from 2020 through 2021, which MOB reports was helpful. MOB identified FOB and her mother as supports. MOB stated she is currently doing well and denies any SI or HI.    CSW provided education regarding the baby blues period vs. perinatal mood disorders, discussed treatment and gave resources for mental health follow up if concerns arise.  CSW recommends self-evaluation during the postpartum time period using the New Mom Checklist from Postpartum Progress and encouraged MOB to contact a medical professional if symptoms are noted at any time.   CSW provided review of Sudden Infant Death Syndrome (SIDS) precautions. MOB reported baby will sleep in a basinet. MOB stated she has all essential needs for baby, including a car seat. MOB identified Dr. Marcello Moores with [facility] for follow-up care and denies any transportation barriers. MOB declined having any other needs at this time.  CSW identifies no further need for intervention and no barriers to discharge at this time.  Darra Lis, [place] [employer] [phone]

## 2020-01-29 NOTE — Anesthesia Postprocedure Evaluation (Signed)
Anesthesia Post Note  Patient: [name]  Procedure(s) Performed: AN AD HOC LABOR EPIDURAL     Patient location during evaluation: Mother Baby Anesthesia Type: Epidural Level of consciousness: awake and alert and oriented Pain management: satisfactory to patient Vital Signs Assessment: post-procedure vital signs reviewed and stable Respiratory status: respiratory function stable Cardiovascular status: stable Postop Assessment: no headache, no backache, epidural receding, patient able to bend at knees, no signs of nausea or vomiting, adequate PO intake and able to ambulate Anesthetic complications: no   No complications documented.  Last Vitals:  Vitals:   01/29/20 0125 01/29/20 0500  BP: 106/62 100/73  Pulse: 72 66  Resp: 18 17  Temp: 36.9 C 36.5 C  SpO2: 98% 99%    Last Pain:  Vitals:   01/29/20 0545  TempSrc:   PainSc: 4    Pain Goal:                   [name]

## 2020-01-29 NOTE — Lactation Note (Signed)
This note was copied from a baby's chart. Lactation Consultation Note  Patient Name: [name] [id number] Date: 01/29/2020 Reason for consult: Follow-up assessment;1st time breastfeeding;Term;Infant weight loss Age:26 hours P2, term female infant -1% weight loss. Mom is breastfeeding and supplementing infant with formula her choice. LC did not see latch at this time, Per mom, infant BF less than 2 hours ago for 15 minutes and she supplemented infant with 25 mls of formula. LC reviewed breastfeeding supplement sheet with mom, day 2 offer 7-15 mls per feeding not 25 mls. Mom concern infant not getting anything she choose supplement infant with formula instead of donor breast milk, LC explain sometimes it may take longer sometimes 48 hours, due mom not seeing any colostrum expressed with hand expression. Mom's plan for Day 2 : 1-Mom will latch infant at breast for every feeding according to hunger cues, 8 to 12+ times within 24 hours for breast stimulation and help establish milk supply. 2-After latching infant at the breast day will supplement infant with formula ( 7-15 mls) per feeding. 3-Mom will consistently use Medela hand pump every 3 hours for 15 minutes on initial setting. 4-Mom knows to call [facility] services if she has any questions, concerns or needs assistance with latching infant at the breast.  Maternal Data    Feeding Feeding Type: Bottle Fed - Formula  LATCH Score                   Interventions Interventions: Skin to skin;Hand express;Expressed milk;DEBP;Breast massage;Breast compression  Lactation Tools Discussed/Used     Consult Status Consult Status: Follow-up Date: 01/30/20 Follow-up type: In-patient    Danelle Earthly 01/29/2020, 5:28 PM

## 2020-01-30 MED ORDER — IBUPROFEN 600 MG PO TABS: 600.0000 mg | ORAL_TABLET | Freq: Four times a day (QID) | ORAL | 0 refills | Status: DC

## 2020-01-30 MED ORDER — ACETAMINOPHEN 325 MG PO TABS: 650.0000 mg | ORAL_TABLET | ORAL | 0 refills | Status: DC | PRN

## 2020-01-30 NOTE — Discharge Instructions (Signed)

## 2020-01-30 NOTE — Lactation Note (Signed)
This note was copied from a baby's chart. Lactation Consultation Note  Patient Name: [name] [id number] Date: 01/30/2020   Age:26 hours  [name] [name] now 72 hours old.  Parents report they are being d/c today.  Parents have a feeding plan for d/c. Parents have a plan to breastfeed him at the breast first.  Then dad to feed back pumped breastmilk and or formula while mom pumps.  Parents have recommended supplementation amount sheet.  Discussed how hopefully things will progress and [name] will get less and less supplement as mom gets more and more volume and [name] continues to learn to breastfeed.  Urged to feed on cue and 8-12 or more times day.  Issues [employer] pump to parents.  Mom prefers [employer] flex.  Urged parents to call lactation as needed.  Halie Gass S Patrizia Paule 01/30/2020, 10:37 AM

## 2020-01-30 NOTE — Progress Notes (Signed)
Post Partum Day 2 Subjective: no complaints, up ad lib, voiding and tolerating PO  Objective: Patient Vitals for the past 24 hrs:  BP Temp Temp src Pulse Resp SpO2  01/30/20 0537 107/75 98.4 F (36.9 C) Oral (!) 57 - -  01/29/20 2240 128/81 98.2 F (36.8 C) Oral 69 18 99 %  01/29/20 1847 121/75 98 F (36.7 C) - 88 19 99 %  01/29/20 0940 112/71 97.7 F (36.5 C) - 66 18 99 %    Physical Exam:  General: alert and no distress Lochia: appropriate Uterine Fundus: firm DVT Evaluation: No evidence of DVT seen on physical exam.  Recent Labs    01/28/20 0834 01/29/20 0607  WBC 6.8 13.1*  HGB 12.8 11.6*  HCT 39.8 36.2  PLT 194 184   Assessment/Plan: Discharge home  [name] 25 y.o. [id number] PPD#2 sp SVD at [redacted]w[redacted]d  1. PPC: Hgb 12.8>11.6, EBL 77, no lacs. Doing well  2. Depression: no meds, no history of PPD, reviewed s/sx PPD 3. Vaccines: tdap/flu in pregnancy, COVID series 10/2019 Rubella immune, blood type AB POS, breast & formula feeding, baby boy in room   LOS: 2 days   Anna Livers K Taam-Akelman 01/30/2020, 8:48 AM

## 2020-01-30 NOTE — Discharge Summary (Signed)
Postpartum Discharge Summary    Patient Name: [name] DOB: Jan 01, 1996 MRN: [id number]  Date of admission: 01/28/2020 Delivery date:01/28/2020  Delivering provider: Allyn Kenner  Date of discharge: 01/30/2020  Admitting diagnosis: Pregnant and not yet delivered [Z34.90] Intrauterine pregnancy: [redacted]w[redacted]d    Secondary diagnosis:  Active Problems:   Pregnant and not yet delivered  Additional problems: none    Discharge diagnosis: Term Pregnancy Delivered                                              Post partum procedures:none Augmentation: AROM and Pitocin Complications: None  Hospital course: Induction of Labor With Vaginal Delivery   25y.o. yo [id number] admitted to the hospital 01/28/2020 for induction of labor.  Indication for induction: term.  Patient had an uncomplicated labor course as follows: Membrane Rupture Time/Date: 10:22 AM ,01/28/2020   Delivery Method:Vaginal, Spontaneous  Episiotomy: None  Lacerations:  None  Details of delivery can be found in separate delivery note.  Patient had a routine postpartum course. Patient is discharged home 01/30/20.  Newborn Data: Birth date:01/28/2020  Birth time:5:39 PM  Gender:Female  Living status:Living  Apgars:8 ,9  Weight:3590 g   Magnesium Sulfate received: No BMZ received: No Rhophylac:N/A MMR:N/A T-DaP:Given prenatally Flu: Yes Transfusion:No  Physical exam  Vitals:   01/29/20 0940 01/29/20 1847 01/29/20 2240 01/30/20 0537  BP: 112/71 121/75 128/81 107/75  Pulse: 66 88 69 (!) 57  Resp: '18 19 18   ' Temp: 97.7 F (36.5 C) 98 F (36.7 C) 98.2 F (36.8 C) 98.4 F (36.9 C)  TempSrc:   Oral Oral  SpO2: 99% 99% 99%   Weight:      Height:       General: alert and no distress Lochia: appropriate Uterine Fundus: firm DVT Evaluation: No evidence of DVT seen on physical exam. Labs: Lab Results  Component Value Date   WBC 13.1 (H) 01/29/2020   HGB 11.6 (L) 01/29/2020   HCT 36.2 01/29/2020   MCV  88.5 01/29/2020   PLT 184 01/29/2020   CMP Latest Ref Rng & Units 06/15/2017  Glucose 70 - 99 mg/dL 91  BUN 6 - 23 mg/dL 11  Creatinine 0.40 - 1.20 mg/dL 0.81  Sodium 135 - 145 mEq/L 140  Potassium 3.5 - 5.1 mEq/L 3.8  Chloride 96 - 112 mEq/L 106  CO2 19 - 32 mEq/L 25  Calcium 8.4 - 10.5 mg/dL 9.4   Edinburgh Score: Edinburgh Postnatal Depression Scale Screening Tool 01/29/2020  I have been able to laugh and see the funny side of things. 0  I have looked forward with enjoyment to things. 0  I have blamed myself unnecessarily when things went wrong. 0  I have been anxious or worried for no good reason. 0  I have felt scared or panicky for no good reason. 0  Things have been getting on top of me. 0  I have been so unhappy that I have had difficulty sleeping. 0  I have felt sad or miserable. 0  I have been so unhappy that I have been crying. 0  The thought of harming myself has occurred to me. 0  Edinburgh Postnatal Depression Scale Total 0      After visit meds:  Allergies as of 01/30/2020   No Known Allergies     Medication List  TAKE these medications   acetaminophen 325 MG tablet Commonly known as: Tylenol Take 2 tablets (650 mg total) by mouth every 4 (four) hours as needed (for pain scale < 4).   ibuprofen 600 MG tablet Commonly known as: ADVIL Take 1 tablet (600 mg total) by mouth every 6 (six) hours.        Discharge home in stable condition Infant Feeding: Bottle and Breast Infant Disposition:home with mother Discharge instruction: per After Visit Summary and Postpartum booklet. Activity: Advance as tolerated. Pelvic rest for 6 weeks.  Diet: routine diet Anticipated Birth Control: Unsure Postpartum Appointment:4 weeks Additional Postpartum F/U: none Future Appointments:No future appointments. Follow up Visit:  Follow-up Information    Allyn Kenner, DO. Schedule an appointment as soon as possible for a visit in 4 week(s).   Specialty: Obstetrics  and Gynecology Contact information: [address] [place] [address] [phone]                   01/30/2020 Jonelle Sidle, MD

## 2020-03-12 DIAGNOSIS — R87810 Cervical high risk human papillomavirus (HPV) DNA test positive: Secondary | ICD-10-CM | POA: Diagnosis not present

## 2020-03-12 DIAGNOSIS — R87610 Atypical squamous cells of undetermined significance on cytologic smear of cervix (ASC-US): Secondary | ICD-10-CM | POA: Diagnosis not present

## 2020-04-29 ENCOUNTER — Other Ambulatory Visit: Payer: Self-pay

## 2020-04-29 ENCOUNTER — Ambulatory Visit
Admission: EM | Admit: 2020-04-29 | Discharge: 2020-04-29 | Disposition: A | Discharge status: Home / Self Care | Payer: 59 | Attending: Family Medicine | Admitting: Family Medicine

## 2020-04-29 ENCOUNTER — Encounter: Payer: Self-pay | Admitting: Emergency Medicine

## 2020-04-29 ENCOUNTER — Ambulatory Visit (INDEPENDENT_AMBULATORY_CARE_PROVIDER_SITE_OTHER): Payer: 59

## 2020-04-29 DIAGNOSIS — S99921A Unspecified injury of right foot, initial encounter: Secondary | ICD-10-CM | POA: Diagnosis not present

## 2020-04-29 DIAGNOSIS — M7989 Other specified soft tissue disorders: Secondary | ICD-10-CM | POA: Diagnosis not present

## 2020-04-29 MED ORDER — NAPROXEN 500 MG PO TABS: 500.0000 mg | ORAL_TABLET | Freq: Two times a day (BID) | ORAL | 0 refills | Status: DC

## 2020-04-29 NOTE — ED Triage Notes (Signed)
Pt presents with right pinky toe pain. States hit on wall approx 1 week ago. States swollen and painful with movement and walking.

## 2020-04-29 NOTE — ED Provider Notes (Signed)
[facility]    CSN: [id number] Arrival date & time: 04/29/20  1000      History   Chief Complaint Chief Complaint  Patient presents with  . Toe Pain    HPI [name] is a 25 y.o. female.   HPI  Patient reports accidentally kicked the wall over a week injury 5th right toe. 5th right toe has remained painful and she is concerned for possible fracture. Taken ibuprofen without relief. Past Medical History:  Diagnosis Date  . Depression   . MDD (major depressive disorder)   . Missed ab   . Wears glasses     Patient Active Problem List   Diagnosis Date Noted  . Pregnant and not yet delivered 01/28/2020  . Missed abortion 04/07/2019  . Cold intolerance 06/15/2017  . Fatigue 06/15/2017    Past Surgical History:  Procedure Laterality Date  . DILATION AND EVACUATION N/A 04/07/2019   Procedure: DILATATION AND EVACUATION;  Surgeon: Lavina Hamman, MD;  Location: [facility];  Service: Gynecology;  Laterality: N/A;  . KNEE ARTHROSCOPY Right 2011  . TONSILLECTOMY AND ADENOIDECTOMY  2000  . WISDOM TOOTH EXTRACTION  2019    OB History    Gravida  4   Para  2   Term  2   Preterm      AB  2   Living  2     SAB  2   IAB      Ectopic      Multiple  0   Live Births  2            Home Medications    Prior to Admission medications   Medication Sig Start Date End Date Taking? Authorizing Provider  naproxen (NAPROSYN) 500 MG tablet Take 1 tablet (500 mg total) by mouth 2 (two) times daily. 04/29/20  Yes Bing Neighbors, FNP  acetaminophen (TYLENOL) 325 MG tablet Take 2 tablets (650 mg total) by mouth every 4 (four) hours as needed (for pain scale < 4). 01/30/20   Taam-Akelman, Griselda Miner, MD  ibuprofen (ADVIL) 600 MG tablet Take 1 tablet (600 mg total) by mouth every 6 (six) hours. 01/30/20   Rande Brunt, MD    Family History Family History  Problem Relation Age of Onset  . Diabetes Maternal Grandmother    . Arthritis Maternal Grandmother   . Diabetes Paternal Grandmother   . Hypertension Mother   . Healthy Father     Social History Social History   Tobacco Use  . Smoking status: Never Smoker  . Smokeless tobacco: Never Used  Vaping Use  . Vaping Use: Former  Substance Use Topics  . Alcohol use: Not Currently    Comment: occ  . Drug use: Never     Allergies   Patient has no known allergies.   Review of Systems Review of Systems Pertinent negatives listed in HPI  Physical Exam Triage Vital Signs ED Triage Vitals  Enc Vitals Group     BP 04/29/20 1045 96/62     Pulse Rate 04/29/20 1045 76     Resp 04/29/20 1045 16     Temp 04/29/20 1045 98 F (36.7 C)     Temp Source 04/29/20 1045 Oral     SpO2 04/29/20 1045 98 %     Weight --      Height --      Head Circumference --      Peak Flow --  Pain Score 04/29/20 1043 0     Pain Loc --      Pain Edu? --      Excl. in GC? --    No data found.  Updated Vital Signs BP 96/62 (BP Location: Left Arm)   Pulse 76   Temp 98 F (36.7 C) (Oral)   Resp 16   SpO2 98%   Visual Acuity Right Eye Distance:   Left Eye Distance:   Bilateral Distance:    Right Eye Near:   Left Eye Near:    Bilateral Near:     Physical Exam Constitutional:      Appearance: Normal appearance.  Cardiovascular:     Rate and Rhythm: Normal rate and regular rhythm.     Pulses:          Dorsalis pedis pulses are 2+ on the right side and 2+ on the left side.  Pulmonary:     Breath sounds: Normal breath sounds.  Feet:     Right foot:     Skin integrity: Skin integrity normal.     Left foot:     Skin integrity: Skin integrity normal.     Comments: Ecchymosis lateral aspect of 5th toe. Tenderness 5th toe DIP region of right toe, no deformity Skin:    Capillary Refill: Capillary refill takes less than 2 seconds.  Neurological:     Mental Status: She is alert.      UC Treatments / Results  Labs (all labs ordered are listed, but  only abnormal results are displayed) Labs Reviewed - No data to display  EKG   Radiology DG Foot Complete Right  Result Date: 04/29/2020 CLINICAL DATA:  Injury to fifth toe 1 week ago, remains painful EXAM: RIGHT FOOT COMPLETE - 3+ VIEW COMPARISON:  None FINDINGS: Osseous mineralization normal. Joint spaces preserved. No acute fracture, dislocation, or bone destruction. Soft tissue swelling lateral to the fifth MTP joint. IMPRESSION: No acute osseous abnormalities. Electronically Signed   By: Ulyses Southward M.D.   On: 04/29/2020 11:50    Procedures Procedures (including critical care time)  Medications Ordered in UC Medications - No data to display  Initial Impression / Assessment and Plan / UC Course  I have reviewed the triage vital signs and the nursing notes.  Pertinent labs & imaging results that were available during my care of the patient were reviewed by me and considered in my medical decision making (see chart for details).    Imaging is negative for acute fracture. Recommend Naprosyn 500 mg twice daily x 3-4 days and soaking in warm water. Pain is self-limiting will eventually resolve. PCP follow-up as needed Final Clinical Impressions(s) / UC Diagnoses   Final diagnoses:  Injury of toe on right foot, initial encounter     Discharge Instructions     Naproxen 500 mg twice per day for 3-4 days and water foot soaks daily. No fracture per imaging today.    ED Prescriptions    Medication Sig Dispense Auth. Provider   naproxen (NAPROSYN) 500 MG tablet Take 1 tablet (500 mg total) by mouth 2 (two) times daily. 30 tablet Bing Neighbors, FNP     PDMP not reviewed this encounter.   Bing Neighbors, [place] 04/30/20 [phone]

## 2020-04-29 NOTE — Discharge Instructions (Addendum)
Naproxen 500 mg twice per day for 3-4 days and water foot soaks daily. No fracture per imaging today.

## 2020-12-20 ENCOUNTER — Telehealth: Payer: Self-pay | Admitting: Physician Assistant

## 2020-12-20 DIAGNOSIS — R3989 Other symptoms and signs involving the genitourinary system: Secondary | ICD-10-CM

## 2020-12-20 MED ORDER — SULFAMETHOXAZOLE-TRIMETHOPRIM 800-160 MG PO TABS: 1.0000 | ORAL_TABLET | Freq: Two times a day (BID) | ORAL | 0 refills | Status: AC

## 2020-12-20 NOTE — Patient Instructions (Signed)
Taci Cindie Laroche, thank you for joining [name], PA-C for today's virtual visit.  While this provider is not your primary care provider (PCP), if your PCP is located in our provider database this encounter information will be shared with them immediately following your visit.  Consent: (Patient) Brenley Cindie Laroche provided verbal consent for this virtual visit at the beginning of the encounter.  Current Medications:  Current Outpatient Medications:    sulfamethoxazole-trimethoprim (BACTRIM DS) 800-160 MG tablet, Take 1 tablet by mouth 2 (two) times daily., Disp: 10 tablet, Rfl: 0   Medications ordered in this encounter:  Meds ordered this encounter  Medications   sulfamethoxazole-trimethoprim (BACTRIM DS) 800-160 MG tablet    Sig: Take 1 tablet by mouth 2 (two) times daily.    Dispense:  10 tablet    Refill:  0    Order Specific Question:   Supervising Provider    Answer:   Hyacinth Meeker, BRIAN [[id number]]     *If you need refills on other medications prior to your next appointment, please contact your pharmacy*  Follow-Up: Call back or seek an in-person evaluation if the symptoms worsen or if the condition fails to improve as anticipated.  Other Instructions Urinary Tract Infection, Adult A urinary tract infection (UTI) is an infection of any part of the urinary tract. The urinary tract includes: The kidneys. The ureters. The bladder. The urethra. These organs make, store, and get rid of pee (urine) in the body. What are the causes? This infection is caused by germs (bacteria) in your genital area. These germs grow and cause swelling (inflammation) of your urinary tract. What increases the risk? The following factors may make you more likely to develop this condition: Using a small, thin tube (catheter) to drain pee. Not being able to control when you pee or poop (incontinence). Being female. If you are female, these things can increase the risk: Using these methods to  prevent pregnancy: A medicine that kills sperm (spermicide). A device that blocks sperm (diaphragm). Having low levels of a female hormone (estrogen). Being pregnant. You are more likely to develop this condition if: You have genes that add to your risk. You are sexually active. You take antibiotic medicines. You have trouble peeing because of: A prostate that is bigger than normal, if you are female. A blockage in the part of your body that drains pee from the bladder. A kidney stone. A nerve condition that affects your bladder. Not getting enough to drink. Not peeing often enough. You have other conditions, such as: Diabetes. A weak disease-fighting system (immune system). Sickle cell disease. Gout. Injury of the spine. What are the signs or symptoms? Symptoms of this condition include: Needing to pee right away. Peeing small amounts often. Pain or burning when peeing. Blood in the pee. Pee that smells bad or not like normal. Trouble peeing. Pee that is cloudy. Fluid coming from the vagina, if you are female. Pain in the belly or lower back. Other symptoms include: Vomiting. Not feeling hungry. Feeling mixed up (confused). This may be the first symptom in older adults. Being tired and grouchy (irritable). A fever. Watery poop (diarrhea). How is this treated? Taking antibiotic medicine. Taking other medicines. Drinking enough water. In some cases, you may need to see a specialist. Follow these instructions at home: Medicines Take over-the-counter and prescription medicines only as told by your doctor. If you were prescribed an antibiotic medicine, take it as told by your doctor. Do not stop taking it even  if you start to feel better. General instructions Make sure you: Pee until your bladder is empty. Do not hold pee for a long time. Empty your bladder after sex. Wipe from front to back after peeing or pooping if you are a female. Use each tissue one time when you  wipe. Drink enough fluid to keep your pee pale yellow. Keep all follow-up visits. Contact a doctor if: You do not get better after 1-2 days. Your symptoms go away and then come back. Get help right away if: You have very bad back pain. You have very bad pain in your lower belly. You have a fever. You have chills. You feeling like you will vomit or you vomit. Summary A urinary tract infection (UTI) is an infection of any part of the urinary tract. This condition is caused by germs in your genital area. There are many risk factors for a UTI. Treatment includes antibiotic medicines. Drink enough fluid to keep your pee pale yellow. This information is not intended to replace advice given to you by your health care provider. Make sure you discuss any questions you have with your health care provider. Document Revised: 08/15/2019 Document Reviewed: 08/15/2019 Elsevier Patient Education  2022 [employer].    If you have been instructed to have an in-person evaluation today at a local Urgent Care facility, please use the link below. It will take you to a list of all of our available [facility], including address, phone number and hours of operation. Please do not delay care.  [facility]  If you or a family member do not have a primary care provider, use the link below to schedule a visit and establish care. When you choose a [facility] primary care physician or advanced practice provider, you gain a long-term partner in health. Find a Primary Care Provider  Learn more about [facility]'s in-office and virtual care options: [place] - Get Care Now

## 2020-12-20 NOTE — Progress Notes (Signed)
Virtual Visit Consent   [name], you are scheduled for a virtual visit with a [facility] provider today.     Just as with appointments in the office, your consent must be obtained to participate.  Your consent will be active for this visit and any virtual visit you may have with one of our providers in the next 365 days.     If you have a MyChart account, a copy of this consent can be sent to you electronically.  All virtual visits are billed to your insurance company just like a traditional visit in the office.    As this is a virtual visit, video technology does not allow for your provider to perform a traditional examination.  This may limit your provider's ability to fully assess your condition.  If your provider identifies any concerns that need to be evaluated in person or the need to arrange testing (such as labs, EKG, etc.), we will make arrangements to do so.     Although advances in technology are sophisticated, we cannot ensure that it will always work on either your end or our end.  If the connection with a video visit is poor, the visit may have to be switched to a telephone visit.  With either a video or telephone visit, we are not always able to ensure that we have a secure connection.     I need to obtain your verbal consent now.   Are you willing to proceed with your visit today?    [name] has provided verbal consent on 12/20/2020 for a virtual visit (video or telephone).   [name], PA-C   Date: 12/20/2020 12:05 PM   Virtual Visit via Video Note   I, [name], connected with  [name]  ([id number], 1995-02-27) on 12/20/20 at 12:00 PM EST by a video-enabled telemedicine application and verified that I am speaking with the correct person using two identifiers.  Location: Patient: Virtual Visit Location Patient: Home Provider: Virtual Visit Location Provider: Home Office   I discussed the limitations of evaluation and  management by telemedicine and the availability of in person appointments. The patient expressed understanding and agreed to proceed.    History of Present Illness: [name] is a 25 y.o. who identifies as a female who was assigned female at birth, and is being seen today for possible UTI.  HPI: Urinary Tract Infection  This is a new problem. The current episode started 1 to 4 weeks ago. The problem occurs every urination. The problem has been gradually worsening. The quality of the pain is described as aching and burning. The pain is moderate. There has been no fever. Associated symptoms include frequency, hesitancy and urgency. Pertinent negatives include no chills, discharge, flank pain, nausea or vomiting. She has tried increased fluids (AZO, cranberry juice) for the symptoms. The treatment provided no relief. There is no history of catheterization or recurrent UTIs.     Problems:  Patient Active Problem List   Diagnosis Date Noted   Pregnant and not yet delivered 01/28/2020   Missed abortion 04/07/2019   Cold intolerance 06/15/2017   Fatigue 06/15/2017    Allergies: No Known Allergies Medications:  Current Outpatient Medications:    sulfamethoxazole-trimethoprim (BACTRIM DS) 800-160 MG tablet, Take 1 tablet by mouth 2 (two) times daily., Disp: 10 tablet, Rfl: 0  Observations/Objective: Patient is well-developed, well-nourished in no acute distress.  Resting comfortably at home.  Head is normocephalic, atraumatic.  No labored breathing.  Speech is clear and coherent with logical content.  Patient is alert and oriented at baseline.    Assessment and Plan: 1. Suspected UTI - sulfamethoxazole-trimethoprim (BACTRIM DS) 800-160 MG tablet; Take 1 tablet by mouth 2 (two) times daily.  Dispense: 10 tablet; Refill: 0  - Worsening symptoms.  - Will treat empirically with Bactrim  - Continue to push fluids.  - She is to call or seek in person evaluation if symptoms do not  improve or if they worsen.    Follow Up Instructions: I discussed the assessment and treatment plan with the patient. The patient was provided an opportunity to ask questions and all were answered. The patient agreed with the plan and demonstrated an understanding of the instructions.  A copy of instructions were sent to the patient via MyChart unless otherwise noted below.    The patient was advised to call back or seek an in-person evaluation if the symptoms worsen or if the condition fails to improve as anticipated.  Time:  I spent 10 minutes with the patient via telehealth technology discussing the above problems/concerns.    [name], PA-C

## 2021-03-15 ENCOUNTER — Ambulatory Visit
Admission: EM | Admit: 2021-03-15 | Discharge: 2021-03-15 | Disposition: A | Discharge status: Home / Self Care | Payer: Managed Care, Other (non HMO)

## 2021-03-15 ENCOUNTER — Encounter: Payer: Self-pay | Admitting: Emergency Medicine

## 2021-03-15 DIAGNOSIS — J020 Streptococcal pharyngitis: Secondary | ICD-10-CM | POA: Diagnosis not present

## 2021-03-15 LAB — POCT RAPID STREP A (OFFICE): Rapid Strep A Screen: POSITIVE — AB

## 2021-03-15 MED ORDER — AMOXICILLIN 500 MG PO CAPS: 500.0000 mg | ORAL_CAPSULE | Freq: Three times a day (TID) | ORAL | 0 refills | Status: DC

## 2021-03-15 MED ORDER — ACETAMINOPHEN 325 MG PO TABS
650.0000 mg | ORAL_TABLET | Freq: Once | ORAL | Status: AC
  Administered 2021-03-15: 650 mg via ORAL

## 2021-03-15 NOTE — ED Triage Notes (Signed)
Patient c/o fever, body aches, sore throat, headache x 1 day.  Denies any OTC meds.

## 2021-03-15 NOTE — ED Provider Notes (Signed)
[facility]    CSN: [id number] Arrival date & time: 03/15/21  1550      History   Chief Complaint Chief Complaint  Patient presents with   Sore Throat    HPI [name] is a 26 y.o. female.   Patient here today for evaluation of fever, body aches, sore throat and headache that she has had for the last day.  She reports that she has not had any over-the-counter treatment for symptoms.  She denies any cough.  She has not had any vomiting or diarrhea but does endorse some nausea.  The history is provided by the patient.  Sore Throat Pertinent negatives include no abdominal pain and no shortness of breath.   Past Medical History:  Diagnosis Date   Depression    MDD (major depressive disorder)    Missed ab    Wears glasses     Patient Active Problem List   Diagnosis Date Noted   Pregnant and not yet delivered 01/28/2020   Missed abortion 04/07/2019   Cold intolerance 06/15/2017   Fatigue 06/15/2017    Past Surgical History:  Procedure Laterality Date   DILATION AND EVACUATION N/A 04/07/2019   Procedure: DILATATION AND EVACUATION;  Surgeon: Lavina Hamman, MD;  Location: [facility];  Service: Gynecology;  Laterality: N/A;   KNEE ARTHROSCOPY Right 2011   TONSILLECTOMY AND ADENOIDECTOMY  2000   WISDOM TOOTH EXTRACTION  2019    OB History     Gravida  4   Para  2   Term  2   Preterm      AB  2   Living  2      SAB  2   IAB      Ectopic      Multiple  0   Live Births  2            Home Medications    Prior to Admission medications   Medication Sig Start Date End Date Taking? Authorizing Provider  amoxicillin (AMOXIL) 500 MG capsule Take 1 capsule (500 mg total) by mouth 3 (three) times daily. 03/15/21  Yes Tomi Bamberger, PA-C  Escitalopram Oxalate (LEXAPRO PO) Take by mouth.   Yes [provider]  sulfamethoxazole-trimethoprim (BACTRIM DS) 800-160 MG tablet Take 1 tablet by mouth 2 (two) times  daily. 12/20/20   [name], PA-C    Family History Family History  Problem Relation Age of Onset   Diabetes Maternal Grandmother    Arthritis Maternal Grandmother    Diabetes Paternal Grandmother    Hypertension Mother    Healthy Father     Social History Social History   Tobacco Use   Smoking status: Never   Smokeless tobacco: Never  Vaping Use   Vaping Use: Former  Substance Use Topics   Alcohol use: Not Currently    Comment: occ   Drug use: Never     Allergies   Patient has no known allergies.   Review of Systems Review of Systems  Constitutional:  Positive for fever.  HENT:  Positive for sore throat. Negative for congestion and ear pain.   Eyes:  Negative for discharge and redness.  Respiratory:  Negative for cough, shortness of breath and wheezing.   Gastrointestinal:  Positive for nausea. Negative for abdominal pain, diarrhea and vomiting.  Musculoskeletal:  Positive for myalgias.    Physical Exam Triage Vital Signs ED Triage Vitals [03/15/21 1603]  Enc Vitals Group  BP 112/73     Pulse Rate (!) 110     Resp      Temp (!) 101 F (38.3 C)     Temp Source Oral     SpO2 95 %     Weight 175 lb (79.4 kg)     Height 5\' 5"  (1.651 m)     Head Circumference      Peak Flow      Pain Score 8     Pain Loc      Pain Edu?      Excl. in GC?    No data found.  Updated Vital Signs BP 112/73 (BP Location: Left Arm)    Pulse (!) 110    Temp (!) 101 F (38.3 C) (Oral)    Ht 5\' 5"  (1.651 m)    Wt 175 lb (79.4 kg)    LMP 02/28/2021    SpO2 95%    Breastfeeding No    BMI 29.12 kg/m   Physical Exam Vitals and nursing note reviewed.  Constitutional:      General: She is not in acute distress.    Appearance: Normal appearance. She is not ill-appearing.  HENT:     Head: Normocephalic and atraumatic.     Nose: Congestion present.     Mouth/Throat:     Mouth: Mucous membranes are moist.     Pharynx: Posterior oropharyngeal erythema present. No  oropharyngeal exudate.  Eyes:     Conjunctiva/sclera: Conjunctivae normal.  Cardiovascular:     Rate and Rhythm: Normal rate.  Pulmonary:     Effort: Pulmonary effort is normal. No respiratory distress.  Skin:    General: Skin is warm and dry.  Neurological:     Mental Status: She is alert.  Psychiatric:        Mood and Affect: Mood normal.        Thought Content: Thought content normal.     UC Treatments / Results  Labs (all labs ordered are listed, but only abnormal results are displayed) Labs Reviewed  POCT RAPID STREP A (OFFICE) - Abnormal; Notable for the following components:      Result Value   Rapid Strep A Screen Positive (*)    All other components within normal limits    EKG   Radiology No results found.  Procedures Procedures (including critical care time)  Medications Ordered in UC Medications  acetaminophen (TYLENOL) tablet 650 mg (650 mg Oral Given 03/15/21 1608)    Initial Impression / Assessment and Plan / UC Course  I have reviewed the triage vital signs and the nursing notes.  Pertinent labs & imaging results that were available during my care of the patient were reviewed by me and considered in my medical decision making (see chart for details).    Strep test positive in office.  Will treat with amoxicillin and recommended follow-up if no improvement or if symptoms worsen.  Okay to use Tylenol or ibuprofen if needed for pain.  Final Clinical Impressions(s) / UC Diagnoses   Final diagnoses:  Strep pharyngitis   Discharge Instructions   None    ED Prescriptions     Medication Sig Dispense Auth. Provider   amoxicillin (AMOXIL) 500 MG capsule Take 1 capsule (500 mg total) by mouth 3 (three) times daily. 21 capsule 03/02/2021, PA-C      PDMP not reviewed this encounter.   03/17/21, PA-C 03/15/21 1651

## 2021-04-12 ENCOUNTER — Other Ambulatory Visit: Payer: Self-pay

## 2021-04-12 ENCOUNTER — Ambulatory Visit
Admission: RE | Admit: 2021-04-12 | Discharge: 2021-04-12 | Disposition: A | Discharge status: Home / Self Care | Payer: Managed Care, Other (non HMO) | Source: Ambulatory Visit | Attending: Physician Assistant | Admitting: Physician Assistant

## 2021-04-12 VITALS — BP 106/72 | HR 118 | Temp 98.7°F | Resp 18

## 2021-04-12 DIAGNOSIS — J029 Acute pharyngitis, unspecified: Secondary | ICD-10-CM

## 2021-04-12 LAB — POCT RAPID STREP A (OFFICE): Rapid Strep A Screen: NEGATIVE

## 2021-04-12 MED ORDER — AMOXICILLIN 500 MG PO CAPS: 500.0000 mg | ORAL_CAPSULE | Freq: Three times a day (TID) | ORAL | 0 refills | Status: AC

## 2021-04-12 NOTE — ED Provider Notes (Signed)
?[facility] ? ? ? ?CSN: [id number] ?Arrival date & time: 04/12/21  1253 ? ? ?  ? ?History   ?Chief Complaint ?Chief Complaint  ?Patient presents with  ? Sore Throat  ?  Entered by patient  ? ? ?HPI ?[name] is a 26 y.o. female.  ? ?Patient here today for evaluation of sore throat that started recently. She is concerned because she did not finish her course of antibiotics for strep. She has felt feverish with sweats at night but has not measured her temperature. She has not taken any medication for symptoms. ? ?The history is provided by the patient.  ?Sore Throat ?Pertinent negatives include no shortness of breath.  ? ?Past Medical History:  ?Diagnosis Date  ? Depression   ? MDD (major depressive disorder)   ? Missed ab   ? Wears glasses   ? ? ?Patient Active Problem List  ? Diagnosis Date Noted  ? Pregnant and not yet delivered 01/28/2020  ? Missed abortion 04/07/2019  ? Cold intolerance 06/15/2017  ? Fatigue 06/15/2017  ? ? ?Past Surgical History:  ?Procedure Laterality Date  ? DILATION AND EVACUATION N/A 04/07/2019  ? Procedure: DILATATION AND EVACUATION;  Surgeon: Lavina Hamman, MD;  Location: [facility];  Service: Gynecology;  Laterality: N/A;  ? KNEE ARTHROSCOPY Right 2011  ? TONSILLECTOMY AND ADENOIDECTOMY  2000  ? WISDOM TOOTH EXTRACTION  2019  ? ? ?OB History   ? ? Gravida  ?4  ? Para  ?2  ? Term  ?2  ? Preterm  ?   ? AB  ?2  ? Living  ?2  ?  ? ? SAB  ?2  ? IAB  ?   ? Ectopic  ?   ? Multiple  ?0  ? Live Births  ?2  ?   ?  ?  ? ? ? ?Home Medications   ? ?Prior to Admission medications   ?Medication Sig Start Date End Date Taking? Authorizing Provider  ?amoxicillin (AMOXIL) 500 MG capsule Take 1 capsule (500 mg total) by mouth 3 (three) times daily. 04/12/21  Yes Tomi Bamberger, PA-C  ?Escitalopram Oxalate (LEXAPRO PO) Take by mouth.    [provider]  ?sulfamethoxazole-trimethoprim (BACTRIM DS) 800-160 MG tablet Take 1 tablet by mouth 2 (two) times daily.  12/20/20   [name], PA-C  ? ? ?Family History ?Family History  ?Problem Relation Age of Onset  ? Diabetes Maternal Grandmother   ? Arthritis Maternal Grandmother   ? Diabetes Paternal Grandmother   ? Hypertension Mother   ? Healthy Father   ? ? ?Social History ?Social History  ? ?Tobacco Use  ? Smoking status: Never  ? Smokeless tobacco: Never  ?Vaping Use  ? Vaping Use: Former  ?Substance Use Topics  ? Alcohol use: Not Currently  ?  Comment: occ  ? Drug use: Never  ? ? ? ?Allergies   ?Patient has no known allergies. ? ? ?Review of Systems ?Review of Systems  ?Constitutional:  Positive for diaphoresis and fever.  ?HENT:  Positive for congestion and sore throat.   ?Eyes:  Negative for discharge and redness.  ?Respiratory:  Negative for cough and shortness of breath.   ? ? ?Physical Exam ?Triage Vital Signs ?ED Triage Vitals  ?Enc Vitals Group  ?   BP 04/12/21 1321 106/72  ?   Pulse Rate 04/12/21 1321 (!) 118  ?   Resp 04/12/21 1321 18  ?   Temp 04/12/21  1321 98.7 ?F (37.1 ?C)  ?   Temp Source 04/12/21 1321 Oral  ?   SpO2 04/12/21 1321 98 %  ?   Weight --   ?   Height --   ?   Head Circumference --   ?   Peak Flow --   ?   Pain Score 04/12/21 1322 8  ?   Pain Loc --   ?   Pain Edu? --   ?   Excl. in [place]? --   ? ?No data found. ? ?Updated Vital Signs ?BP 106/72 (BP Location: Left Arm)   Pulse (!) 118   Temp 98.7 ?F (37.1 ?C) (Oral)   Resp 18   SpO2 98%  ? ?Physical Exam ?Vitals and nursing note reviewed.  ?Constitutional:   ?   General: She is not in acute distress. ?   Appearance: She is well-developed. She is not ill-appearing.  ?HENT:  ?   Head: Normocephalic and atraumatic.  ?   Nose: Congestion present. No rhinorrhea.  ?   Mouth/Throat:  ?   Mouth: Mucous membranes are moist.  ?   Pharynx: No oropharyngeal exudate or posterior oropharyngeal erythema.  ?Eyes:  ?   Conjunctiva/sclera: Conjunctivae normal.  ?Cardiovascular:  ?   Rate and Rhythm: Normal rate.  ?Pulmonary:  ?   Effort: Pulmonary effort  is normal. No respiratory distress.  ?Neurological:  ?   Mental Status: She is alert.  ?Psychiatric:     ?   Mood and Affect: Mood normal.  ? ? ? ?UC Treatments / Results  ?Labs ?(all labs ordered are listed, but only abnormal results are displayed) ?Labs Reviewed  ?POCT RAPID STREP A (OFFICE)  ? ? ?EKG ? ? ?Radiology ?No results found. ? ?Procedures ?Procedures (including critical care time) ? ?Medications Ordered in UC ?Medications - No data to display ? ?Initial Impression / Assessment and Plan / UC Course  ?I have reviewed the triage vital signs and the nursing notes. ? ?Pertinent labs & imaging results that were available during my care of the patient were reviewed by me and considered in my medical decision making (see chart for details). ? ? Strep negative in office but will treat to cover with amoxicillin given appearance of oropharynx on exam and subjective fever. Encouraged follow up with any further concerns.  ? ?Final Clinical Impressions(s) / UC Diagnoses  ? ?Final diagnoses:  ?Acute pharyngitis, unspecified etiology  ? ?Discharge Instructions   ?None ?  ? ?ED Prescriptions   ? ? Medication Sig Dispense Auth. Provider  ? amoxicillin (AMOXIL) 500 MG capsule Take 1 capsule (500 mg total) by mouth 3 (three) times daily. 21 capsule Tomi Bamberger, PA-C  ? ?  ? ?PDMP not reviewed this encounter. ?  ?Tomi Bamberger, PA-C ?04/12/21 1353 ? ?

## 2021-04-12 NOTE — ED Triage Notes (Signed)
Pt c/o sore throat and sts she did not finish her antibiotics for recent strep ?

## 2021-04-19 IMAGING — US US OB TRANSVAGINAL
1 series · 15 of 28 positions shown · non-contrast
Comparison: 01/27/2019

CLINICAL DATA: Pregnancy of unknown anatomic location

EXAM:
TRANSVAGINAL OB ULTRASOUND
TECHNIQUE: Transvaginal ultrasound was performed for complete evaluation of the
gestation as well as the maternal uterus, adnexal regions, and
pelvic cul-de-sac.

[Series 1: us ob transvaginal · 37 acquisitions, 15 frames shown]
[im 1/37]
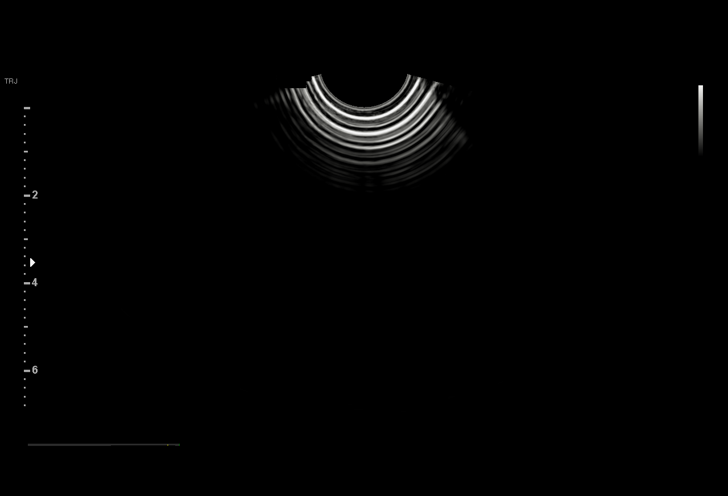
[im 3/37]
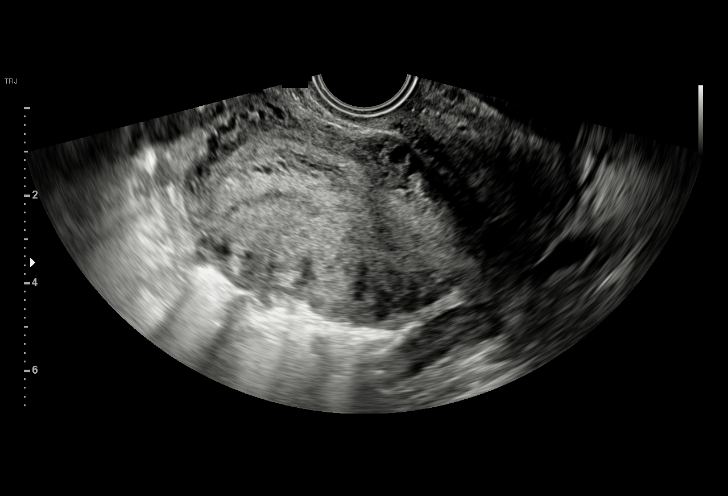
[im 6/37]
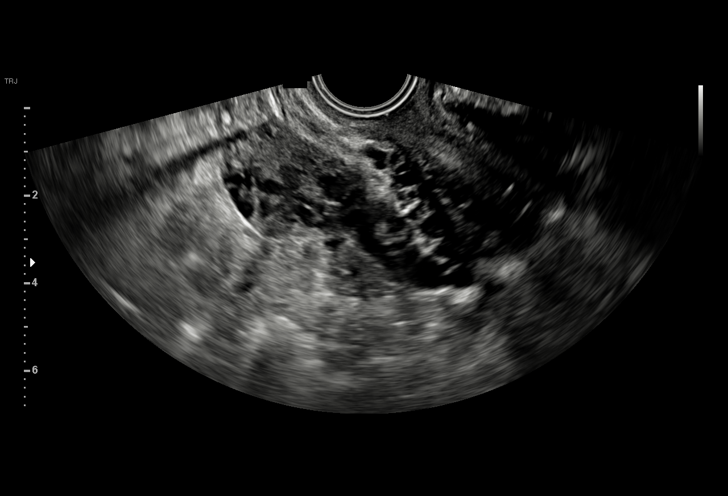
[im 9/37]
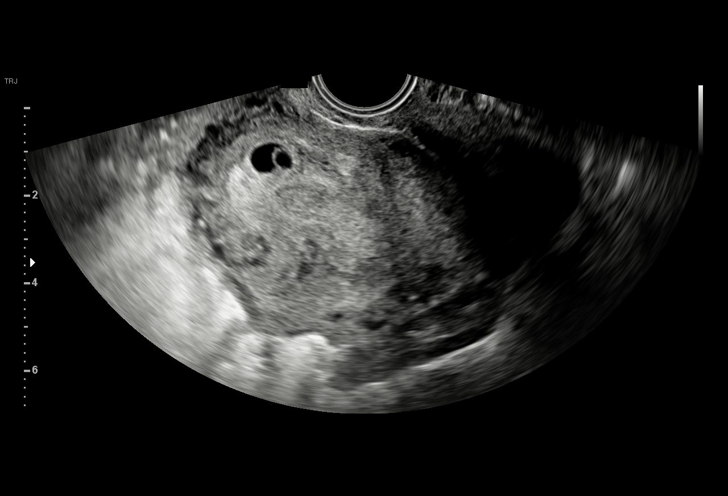
[im 11/37]
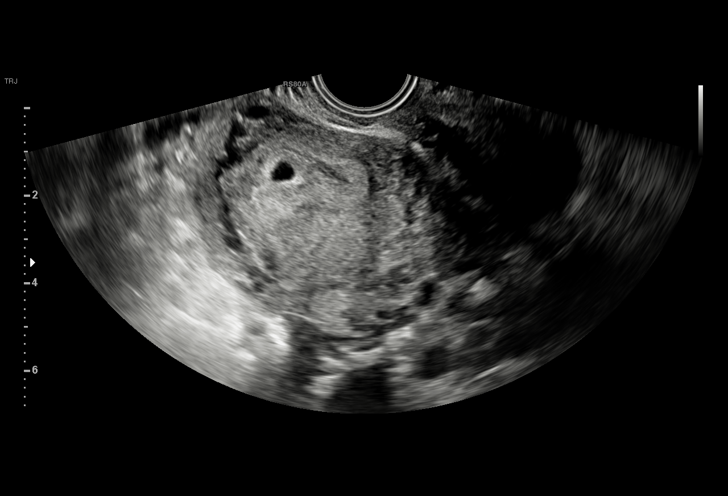
[im 14/37]
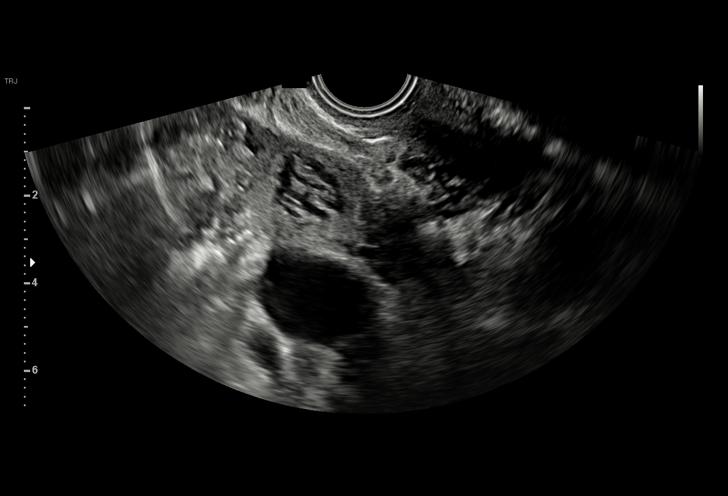
[im 17/37]
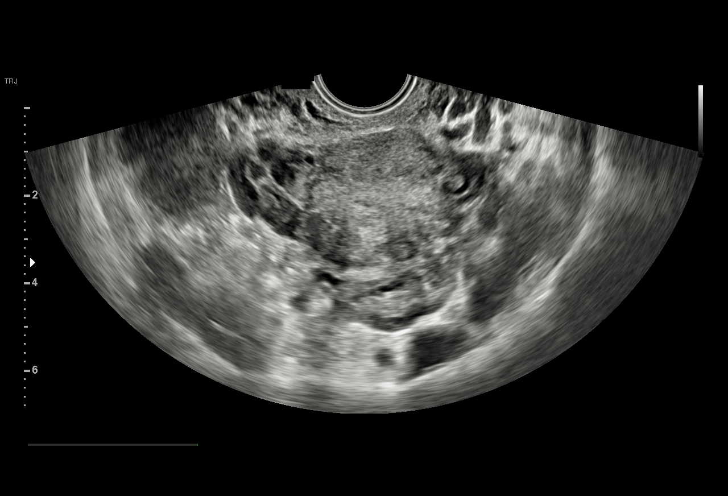
[im 19/37]
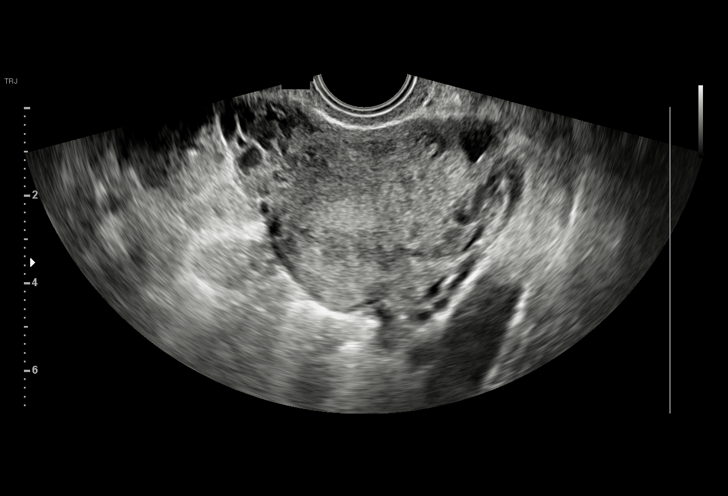
[im 21/37]
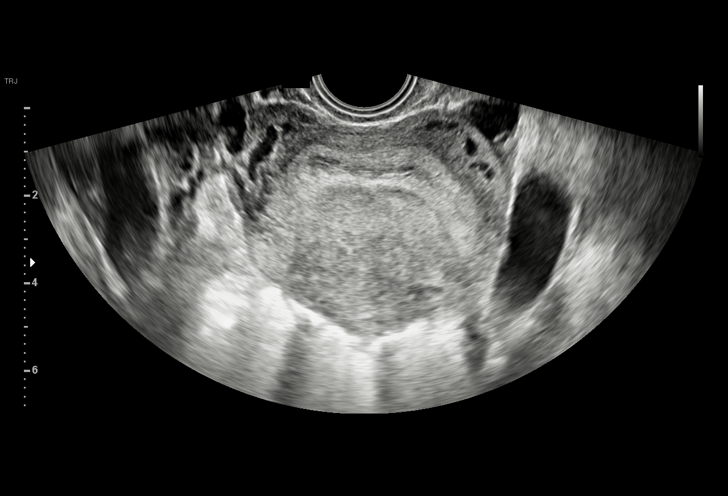
[im 23/37]
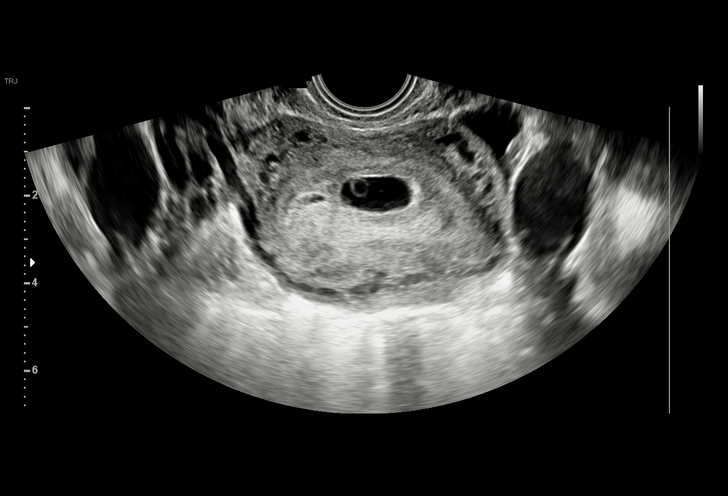
[im 26/37]
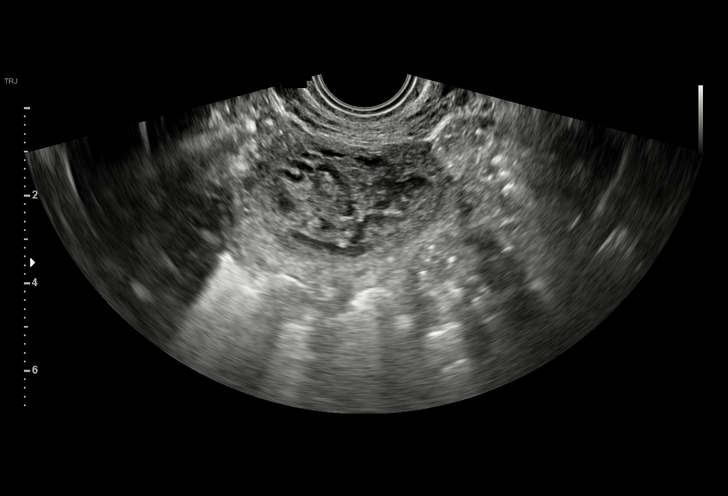
[im 29/37]
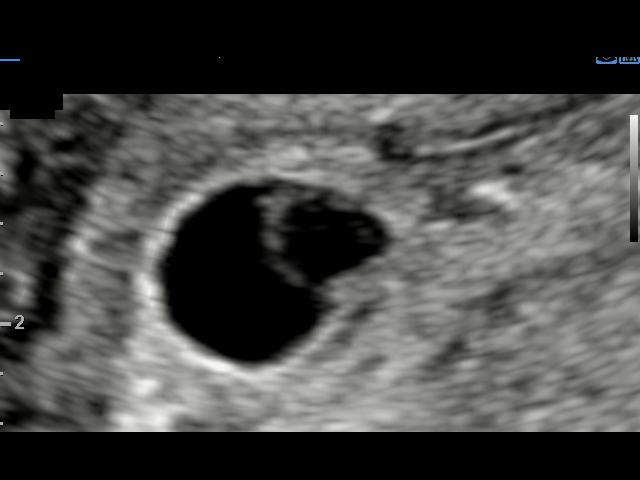
[im 31/37]
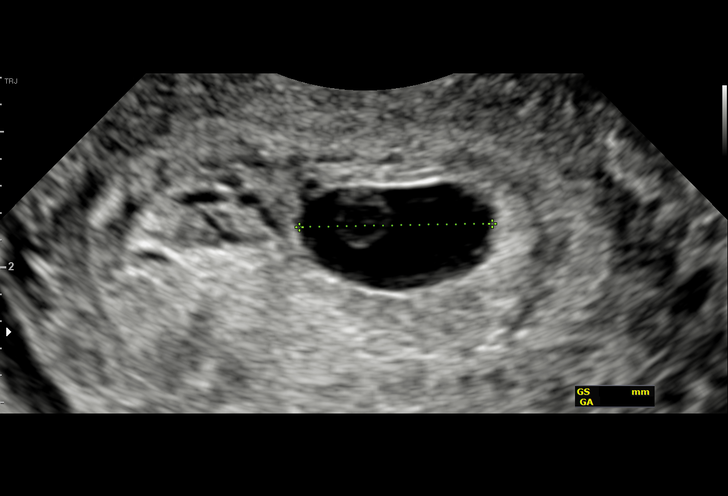
[im 34/37]
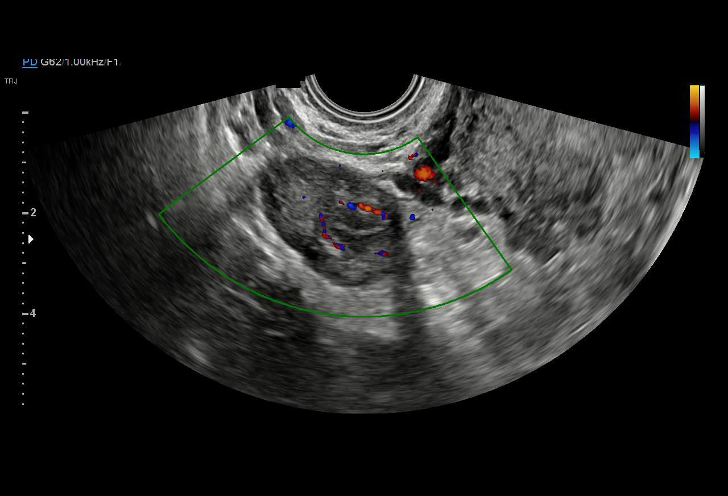
[im 37/37]
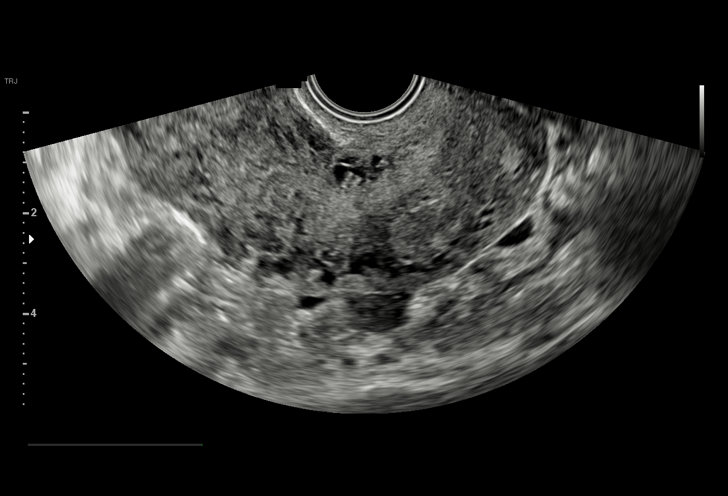

[15 of 28 positions shown; findings below may reference images not displayed]

FINDINGS: Intrauterine gestational sac: Present, single

Yolk sac:  Present

Embryo:  Not visualized

Cardiac Activity: N/A

Heart Rate: N/A bpm

MSD: 12.9 mm   6 w   0 d

Subchorionic hemorrhage:  None visualized.

Maternal uterus/adnexae:

RIGHT ovary measures 4.0 x 2.1 x 2.7 cm and contains a small corpus
luteum.

LEFT ovary normal size and morphology 3.8 x 1.2 x 2.9 cm.

Trace free pelvic fluid.

No adnexal masses.

Remainder of uterus unremarkable.
IMPRESSION: Small gestational sac identified within the uterus containing a yolk
sac but no fetal pole is visualized; consider follow-up ultrasound
in 14 days to establish viability if clinically indicated.

Remainder of exam unremarkable.

## 2022-02-20 DIAGNOSIS — F4323 Adjustment disorder with mixed anxiety and depressed mood: Secondary | ICD-10-CM | POA: Diagnosis not present

## 2022-03-11 ENCOUNTER — Ambulatory Visit
Admission: EM | Admit: 2022-03-11 | Discharge: 2022-03-11 | Disposition: A | Discharge status: Home / Self Care | Payer: Medicaid Other

## 2022-03-11 ENCOUNTER — Emergency Department (HOSPITAL_BASED_OUTPATIENT_CLINIC_OR_DEPARTMENT_OTHER)
Admission: EM | Admit: 2022-03-11 | Discharge: 2022-03-11 | Disposition: A | Discharge status: Home / Self Care | Payer: 59 | Attending: Emergency Medicine | Admitting: Emergency Medicine

## 2022-03-11 ENCOUNTER — Encounter (HOSPITAL_BASED_OUTPATIENT_CLINIC_OR_DEPARTMENT_OTHER): Payer: Self-pay

## 2022-03-11 ENCOUNTER — Ambulatory Visit (HOSPITAL_COMMUNITY)
Admission: EM | Admit: 2022-03-11 | Discharge: 2022-03-11 | Discharge status: Against Medical Advice | Payer: Medicaid Other

## 2022-03-11 ENCOUNTER — Emergency Department (HOSPITAL_BASED_OUTPATIENT_CLINIC_OR_DEPARTMENT_OTHER): Payer: 59

## 2022-03-11 ENCOUNTER — Other Ambulatory Visit: Payer: Self-pay

## 2022-03-11 VITALS — BP 116/76 | HR 76 | Temp 97.9°F | Resp 16

## 2022-03-11 DIAGNOSIS — M79662 Pain in left lower leg: Secondary | ICD-10-CM | POA: Diagnosis not present

## 2022-03-11 DIAGNOSIS — M79605 Pain in left leg: Secondary | ICD-10-CM | POA: Diagnosis not present

## 2022-03-11 NOTE — Discharge Instructions (Addendum)
Please go to the emergency room to rule out a blood clot as a cause of your symptoms

## 2022-03-11 NOTE — Discharge Instructions (Signed)
You came to the emergency department due to pain in your left leg.  You do not have a blood clot.  It is likely muscular.  You may use Tylenol and ibuprofen as well as ice and heat packs.  Follow-up with your PCP as needed down the line.  Make sure you wear comfortable shoes.  It was a pleasure to meet you and we hope you feel better.

## 2022-03-11 NOTE — ED Triage Notes (Addendum)
Pt with leg pain 1.5 weeks ago to front of left leg and back of left calf that is worse with sitting and raising toes. Pain is worse when sitting. Reports pain feels "vascular".

## 2022-03-11 NOTE — ED Provider Notes (Signed)
[facility]    CSN: [id number] Arrival date & time: 03/11/22  1314      History   Chief Complaint Chief Complaint  Patient presents with   Leg Pain    Entered by patient    HPI [name] is a 27 y.o. female presents for evaluation of calf pain.  Patient is accompanied by her mother.  Patient reports 1 and half weeks of intermittent posterior left calf pain that is worse with walking.  States it has been causing aching in her thigh.  Denies any swelling, warmth, erythema.  No injury or known inciting event.  Denies any calf cramping.  She states it "feels vascular".  No history of clotting disorders.  Denies any long distance travel.  Is not on any oral estrogen or birth control.  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  No history of DVT in the past.  Denies any chest pain but does endorse some shortness of breath intermittently.  No other concerns at this time.   Leg Pain   Past Medical History:  Diagnosis Date   Depression    MDD (major depressive disorder)    Missed ab    Wears glasses     Patient Active Problem List   Diagnosis Date Noted   Pregnant and not yet delivered 01/28/2020   Missed abortion 04/07/2019   Cold intolerance 06/15/2017   Fatigue 06/15/2017    Past Surgical History:  Procedure Laterality Date   DILATION AND EVACUATION N/A 04/07/2019   Procedure: DILATATION AND EVACUATION;  Surgeon: Cheri Fowler, MD;  Location: [facility];  Service: Gynecology;  Laterality: N/A;   KNEE ARTHROSCOPY Right 2011   TONSILLECTOMY AND ADENOIDECTOMY  2000   WISDOM TOOTH EXTRACTION  2019    OB History     Gravida  4   Para  2   Term  2   Preterm      AB  2   Living  2      SAB  2   IAB      Ectopic      Multiple  0   Live Births  2            Home Medications    Prior to Admission medications   Medication Sig  Start Date End Date Taking? Authorizing Provider  amoxicillin (AMOXIL) 500 MG capsule Take 1 capsule (500 mg total) by mouth 3 (three) times daily. 04/12/21   Francene Finders, PA-C  Escitalopram Oxalate (LEXAPRO PO) Take by mouth.    [provider]  sulfamethoxazole-trimethoprim (BACTRIM DS) 800-160 MG tablet Take 1 tablet by mouth 2 (two) times daily. 12/20/20   Mar Daring, PA-C    Family History Family History  Problem Relation Age of Onset   Diabetes Maternal Grandmother    Arthritis Maternal Grandmother    Diabetes Paternal Grandmother    Hypertension Mother    Healthy Father     Social History Social History   Tobacco Use   Smoking status: Never   Smokeless tobacco: Never  Vaping Use   Vaping Use: Former  Substance Use Topics   Alcohol use: Not Currently    Comment: occ   Drug use: Never     Allergies   Patient has no known allergies.   Review of Systems Review of Systems  Musculoskeletal:  Left calf pain     Physical Exam Triage Vital Signs ED Triage Vitals  Enc Vitals Group     BP 03/11/22 1354 116/76     Pulse Rate 03/11/22 1354 76     Resp 03/11/22 1354 16     Temp 03/11/22 1354 97.9 F (36.6 C)     Temp Source 03/11/22 1354 Oral     SpO2 03/11/22 1354 95 %     Weight --      Height --      Head Circumference --      Peak Flow --      Pain Score 03/11/22 1352 3     Pain Loc --      Pain Edu? --      Excl. in [place]? --    No data found.  Updated Vital Signs BP 116/76 (BP Location: Right Arm)   Pulse 76   Temp 97.9 F (36.6 C) (Oral)   Resp 16   LMP 02/23/2022 (Exact Date)   SpO2 95%   Visual Acuity Right Eye Distance:   Left Eye Distance:   Bilateral Distance:    Right Eye Near:   Left Eye Near:    Bilateral Near:     Physical Exam Vitals and nursing note reviewed.  Constitutional:      Appearance: Normal appearance.  Eyes:     Pupils: Pupils are equal, round, and reactive to light.  Cardiovascular:      Rate and Rhythm: Normal rate and regular rhythm.     Heart sounds: Normal heart sounds.  Pulmonary:     Effort: Pulmonary effort is normal.     Breath sounds: Normal breath sounds.  Musculoskeletal:       Legs:  Skin:    General: Skin is warm and dry.  Neurological:     General: No focal deficit present.     Mental Status: She is alert and oriented to person, place, and time.  Psychiatric:        Mood and Affect: Mood normal.        Behavior: Behavior normal.    Clinical feature Score     Active cancer (treatment ongoing or within the previous six months or palliative) 0  Paralysis, paresis, or recent plaster immobilization of the lower extremities 0  Recently bedridden for more than three days or major surgery, within four weeks                                                    0  Localized tenderness along the distribution of the deep venous system 1  Entire leg swollen 0  Calf swelling by more than 3 cm when compared to the asymptomatic leg (measured below tibial tuberosity) 0  Pitting edema (greater in the symptomatic leg) 0  Collateral superficial veins (nonvaricose) 0  Alternative diagnosis as likely or more likely than that of deep venous thrombosis -2  Total Score 1     Interpretation    High probability  3 or greater  Moderate probability 1 or 2  Low probability 0 or less  Modification   This clinical model has been modified to take one other clinical feature into account: a previously documented deep vein thrombosis (DVT) is given the score of 1. Using this modified scoring system, DVT is either likely or unlikely, as follows:  DVT likely 2 or greater   DVT unlikely 1 or less     UC Treatments / Results  Labs (all labs ordered are listed, but only abnormal results are displayed) Labs Reviewed - No data  to display  EKG   Radiology No results found.  Procedures Procedures (including critical care time)  Medications Ordered in UC Medications - No data to display  Initial Impression / Assessment and Plan / UC Course  I have reviewed the triage vital signs and the nursing notes.  Pertinent labs & imaging results that were available during my care of the patient were reviewed by me and considered in my medical decision making (see chart for details).     Reviewed exam and symptoms with mom and patient.  Discussed low probability of PE but given her persistent pain, SOB, and possible history of clotting disorder, I did advise ER for further evaluation.  Mom and patient are in agreement with plan will go POV to the emergency room Final Clinical Impressions(s) / UC Diagnoses   Final diagnoses:  None   Discharge Instructions   None    ED Prescriptions   None    PDMP not reviewed this encounter.   Melynda Ripple, NP 03/11/22 [phone]

## 2022-03-11 NOTE — ED Provider Notes (Signed)
[place] Provider Note   CSN: [id number] Arrival date & time: 03/11/22  1444     History  Chief Complaint  Patient presents with   Leg Pain    [name] is a 27 y.o. female presenting with 1 to 2 weeks of left calf pain.  Worse with ambulation.  Denies any swelling.  No history of DVT/PE.  Has not traveled recently and is not on any OCPs.  Does not smoke.  No cough.  Says that it feels "vascular, not bones or muscles."  No burning.  No previous injury.  No new shoes.   Leg Pain      Home Medications Prior to Admission medications   Medication Sig Start Date End Date Taking? Authorizing Provider  amoxicillin (AMOXIL) 500 MG capsule Take 1 capsule (500 mg total) by mouth 3 (three) times daily. 04/12/21   Francene Finders, PA-C  Escitalopram Oxalate (LEXAPRO PO) Take by mouth.    [provider]  sulfamethoxazole-trimethoprim (BACTRIM DS) 800-160 MG tablet Take 1 tablet by mouth 2 (two) times daily. 12/20/20   Mar Daring, PA-C      Allergies    Patient has no known allergies.    Review of Systems   Review of Systems  Physical Exam Updated Vital Signs BP 121/78 (BP Location: Right Arm)   Pulse 77   Temp 98.2 F (36.8 C)   Resp 18   Ht '5\' 5"'$  (1.651 m)   Wt 79.4 kg   LMP 02/23/2022 (Exact Date)   SpO2 100%   BMI 29.12 kg/m  Physical Exam Vitals and nursing note reviewed.  Constitutional:      Appearance: Normal appearance.  HENT:     Head: Normocephalic and atraumatic.  Eyes:     General: No scleral icterus.    Conjunctiva/sclera: Conjunctivae normal.  Pulmonary:     Effort: Pulmonary effort is normal. No respiratory distress.  Musculoskeletal:     Comments: Full range of motion of the knee and ankle.  TTP of left posterior thigh.  No abnormalities palpated.  Neurovascularly intact.  Skin:    Findings: No rash.  Neurological:     Mental Status: She is alert.  Psychiatric:        Mood  and Affect: Mood normal.     ED Results / Procedures / Treatments   Labs (all labs ordered are listed, but only abnormal results are displayed) Labs Reviewed - No data to display  EKG None  Radiology US Venous Img Lower Unilateral Left  Result Date: 03/11/2022 CLINICAL DATA:  Left calf pain EXAM: LEFT LOWER EXTREMITY VENOUS DOPPLER ULTRASOUND TECHNIQUE: Gray-scale sonography with compression, as well as color and duplex ultrasound, were performed to evaluate the deep venous system(s) from the level of the common femoral vein through the popliteal and proximal calf veins. COMPARISON:  None Available. FINDINGS: VENOUS Normal compressibility of the common femoral, superficial femoral, and popliteal veins, as well as the visualized calf veins. Visualized portions of profunda femoral vein and great saphenous vein unremarkable. No filling defects to suggest DVT on grayscale or color Doppler imaging. Doppler waveforms show normal direction of venous flow, normal respiratory plasticity and response to augmentation. Limited views of the contralateral common femoral vein are unremarkable. OTHER None. Limitations: none IMPRESSION: Negative. Electronically Signed   By: Jacqulynn Cadet M.D.   On: 03/11/2022 18:02    Procedures Procedures   Medications Ordered in ED Medications - No data to display  ED Course/ Medical Decision Making/ A&P                             Medical Decision Making   27 year old female presenting today with concern for leg pain.  Has been ongoing for the past 1 to 2 weeks.  Differential includes but is not limited to DVT, cellulitis, PAD, PVD, muscle strain neuropathy.  This is not exhaustive.  Per external chart review patient was seen earlier today by urgent care.  They told her to come to the emergency department to have a DVT rule out due to the severity of her pain and "possible history of clotting disorder."  Imaging: DVT study ordered in triage.  I viewed and  interpreted this and agree with that the scan is negative.  MDM/disposition: 27 year old female presenting with left posterior calf pain.  There is no swelling.  Her Wells DVT criteria is 0, despite that she was sent for workup.  DVT study negative.  Do not believe she needs a CT PE scan, PERC negative as well.  Pain is likely musculoskeletal in etiology.  She will use Tylenol and ibuprofen.  Discharged home at this time.   Final Clinical Impression(s) / ED Diagnoses Final diagnoses:  Pain of left calf    Rx / DC Orders ED Discharge Orders     None      Results and diagnoses were explained to the patient. Return precautions discussed in full. Patient had no additional questions and expressed complete understanding.   This chart was dictated using voice recognition software.  Despite best efforts to proofread,  errors can occur which can change the documentation meaning.    Darliss Ridgel 03/11/22 1805    Davonna Belling, MD 03/12/22 1438

## 2022-03-11 NOTE — ED Triage Notes (Signed)
Patient here POV from Home.  Endorses Left Leg Pain that began 1.5 Weeks ago. Localized to posterior Lower Leg.   No fevers or Known Trauma. Sent by UC for [place].   NAD Noted during Triage. A&Ox4. GCS 15. Ambulatory.

## 2022-03-13 DIAGNOSIS — F4323 Adjustment disorder with mixed anxiety and depressed mood: Secondary | ICD-10-CM | POA: Diagnosis not present

## 2022-03-22 DIAGNOSIS — F4323 Adjustment disorder with mixed anxiety and depressed mood: Secondary | ICD-10-CM | POA: Diagnosis not present

## 2022-03-29 DIAGNOSIS — F4323 Adjustment disorder with mixed anxiety and depressed mood: Secondary | ICD-10-CM | POA: Diagnosis not present

## 2022-04-06 DIAGNOSIS — F4323 Adjustment disorder with mixed anxiety and depressed mood: Secondary | ICD-10-CM | POA: Diagnosis not present

## 2022-04-10 DIAGNOSIS — F4323 Adjustment disorder with mixed anxiety and depressed mood: Secondary | ICD-10-CM | POA: Diagnosis not present

## 2022-04-19 DIAGNOSIS — F4323 Adjustment disorder with mixed anxiety and depressed mood: Secondary | ICD-10-CM | POA: Diagnosis not present

## 2022-04-25 DIAGNOSIS — F4323 Adjustment disorder with mixed anxiety and depressed mood: Secondary | ICD-10-CM | POA: Diagnosis not present

## 2022-05-02 DIAGNOSIS — F4323 Adjustment disorder with mixed anxiety and depressed mood: Secondary | ICD-10-CM | POA: Diagnosis not present

## 2022-05-10 DIAGNOSIS — Z202 Contact with and (suspected) exposure to infections with a predominantly sexual mode of transmission: Secondary | ICD-10-CM | POA: Diagnosis not present

## 2022-05-10 DIAGNOSIS — L293 Anogenital pruritus, unspecified: Secondary | ICD-10-CM | POA: Diagnosis not present

## 2022-05-10 DIAGNOSIS — N760 Acute vaginitis: Secondary | ICD-10-CM | POA: Diagnosis not present

## 2022-05-11 DIAGNOSIS — F4323 Adjustment disorder with mixed anxiety and depressed mood: Secondary | ICD-10-CM | POA: Diagnosis not present

## 2022-05-16 DIAGNOSIS — F4323 Adjustment disorder with mixed anxiety and depressed mood: Secondary | ICD-10-CM | POA: Diagnosis not present

## 2022-05-23 DIAGNOSIS — F4323 Adjustment disorder with mixed anxiety and depressed mood: Secondary | ICD-10-CM | POA: Diagnosis not present

## 2022-05-30 DIAGNOSIS — F4323 Adjustment disorder with mixed anxiety and depressed mood: Secondary | ICD-10-CM | POA: Diagnosis not present

## 2022-06-07 DIAGNOSIS — F4323 Adjustment disorder with mixed anxiety and depressed mood: Secondary | ICD-10-CM | POA: Diagnosis not present

## 2022-06-13 DIAGNOSIS — F4323 Adjustment disorder with mixed anxiety and depressed mood: Secondary | ICD-10-CM | POA: Diagnosis not present

## 2023-09-04 DIAGNOSIS — A6000 Herpesviral infection of urogenital system, unspecified: Secondary | ICD-10-CM | POA: Insufficient documentation

## 2023-09-05 ENCOUNTER — Ambulatory Visit: Admitting: Family Medicine

## 2023-10-02 DIAGNOSIS — Z113 Encounter for screening for infections with a predominantly sexual mode of transmission: Secondary | ICD-10-CM | POA: Diagnosis not present

## 2023-10-02 DIAGNOSIS — Z30015 Encounter for initial prescription of vaginal ring hormonal contraceptive: Secondary | ICD-10-CM | POA: Diagnosis not present

## 2023-10-02 DIAGNOSIS — R87810 Cervical high risk human papillomavirus (HPV) DNA test positive: Secondary | ICD-10-CM | POA: Diagnosis not present

## 2023-10-02 DIAGNOSIS — Z01419 Encounter for gynecological examination (general) (routine) without abnormal findings: Secondary | ICD-10-CM | POA: Diagnosis not present

## 2023-10-02 DIAGNOSIS — R87610 Atypical squamous cells of undetermined significance on cytologic smear of cervix (ASC-US): Secondary | ICD-10-CM | POA: Diagnosis not present

## 2023-10-02 DIAGNOSIS — Z1389 Encounter for screening for other disorder: Secondary | ICD-10-CM | POA: Diagnosis not present

## 2023-10-02 DIAGNOSIS — N760 Acute vaginitis: Secondary | ICD-10-CM | POA: Diagnosis not present

## 2023-10-02 DIAGNOSIS — Z8742 Personal history of other diseases of the female genital tract: Secondary | ICD-10-CM | POA: Diagnosis not present

## 2023-10-02 DIAGNOSIS — Z130 Encounter for screening for diseases of the blood and blood-forming organs and certain disorders involving the immune mechanism: Secondary | ICD-10-CM | POA: Diagnosis not present

## 2023-10-09 ENCOUNTER — Ambulatory Visit: Admitting: Family Medicine
